# Patient Record
Sex: Female | Born: 1960 | Race: White | Hispanic: No | Marital: Married | Smoking: Former smoker
Health system: Southern US, Community
[De-identification: ages and names within clinical notes are randomized; demographics above are authoritative.]

## PROBLEM LIST (undated history)

## (undated) DIAGNOSIS — E669 Obesity, unspecified: Secondary | ICD-10-CM

## (undated) DIAGNOSIS — R609 Edema, unspecified: Secondary | ICD-10-CM

## (undated) DIAGNOSIS — I10 Essential (primary) hypertension: Secondary | ICD-10-CM

## (undated) DIAGNOSIS — N943 Premenstrual tension syndrome: Secondary | ICD-10-CM

## (undated) DIAGNOSIS — F419 Anxiety disorder, unspecified: Secondary | ICD-10-CM

## (undated) HISTORY — DX: Essential (primary) hypertension: I10

## (undated) HISTORY — DX: Anxiety disorder, unspecified: F41.9

## (undated) HISTORY — DX: Obesity, unspecified: E66.9

## (undated) HISTORY — DX: Edema, unspecified: R60.9

## (undated) HISTORY — DX: Premenstrual tension syndrome: N94.3

---

## 2007-09-06 ENCOUNTER — Ambulatory Visit: Payer: Self-pay | Admitting: Family Medicine

## 2009-09-10 ENCOUNTER — Emergency Department: Payer: Self-pay | Admitting: Emergency Medicine

## 2009-09-18 ENCOUNTER — Ambulatory Visit: Payer: Self-pay | Admitting: Vascular Surgery

## 2009-09-18 HISTORY — PX: CHOLECYSTECTOMY: SHX55

## 2009-09-22 ENCOUNTER — Inpatient Hospital Stay: Payer: Self-pay | Admitting: Surgery

## 2010-09-24 IMAGING — CT CT ABD-PELV W/ CM
1 of 2 series · 15 of 32 positions shown, 19 images · IV contrast (isovue)
Comparison: None

REASON FOR EXAM: (1) Bile leak; (2) same
COMMENTS:

PROCEDURE:     CT  - CT ABDOMEN / PELVIS  W  - September 22, 2009  [DATE]
RESULT:     History: Abdominal pain
TECHNIQUE: Multiple axial images of the abdomen and pelvis were performed
from the lung bases to the pubic symphysis, with p.o. contrast and with 100
ml of Isovue 370 intravenous contrast.

[Series 2: abdomen · axial · 0.86mm/px · z∈[-627,-132]mm · 15 of 109 slices shown, 19 images]
[im 5/109  soft-tissue]
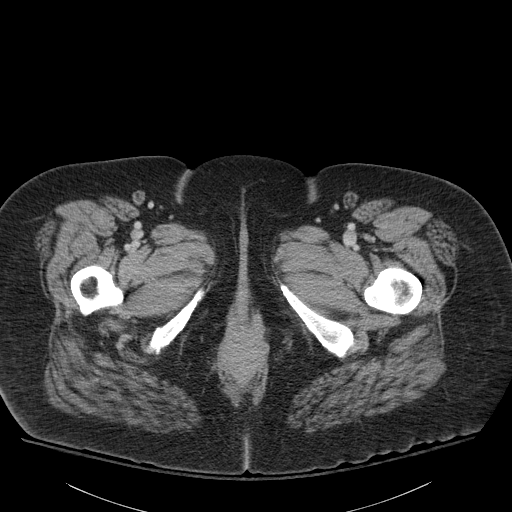
[im 5/109  bone]
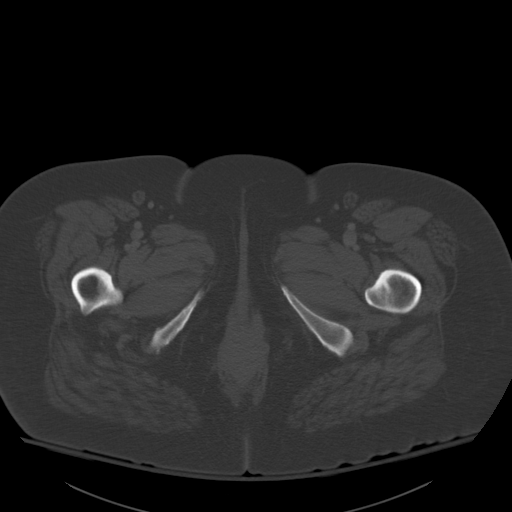
[im 13/109  soft-tissue]
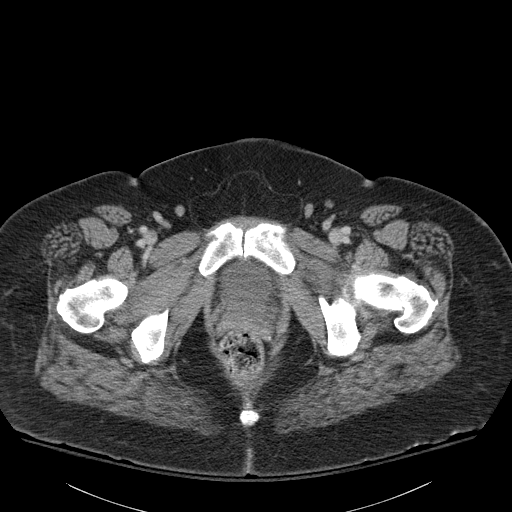
[im 22/109  soft-tissue]
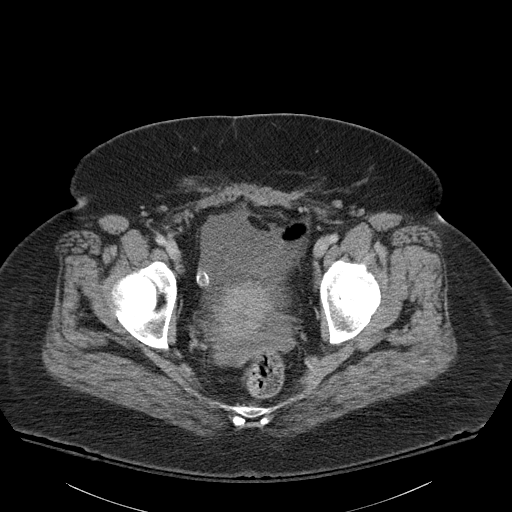
[im 31/109  soft-tissue]
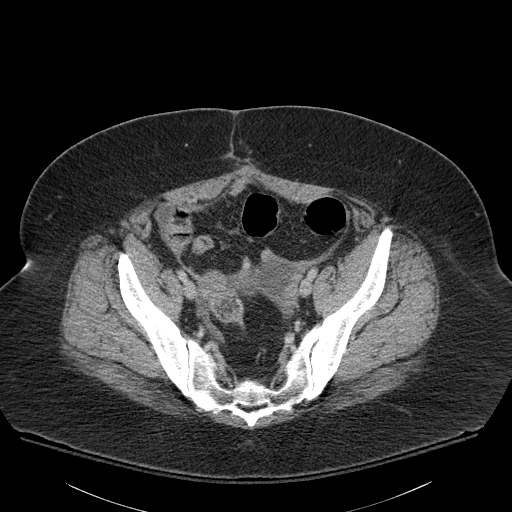
[im 39/109  soft-tissue]
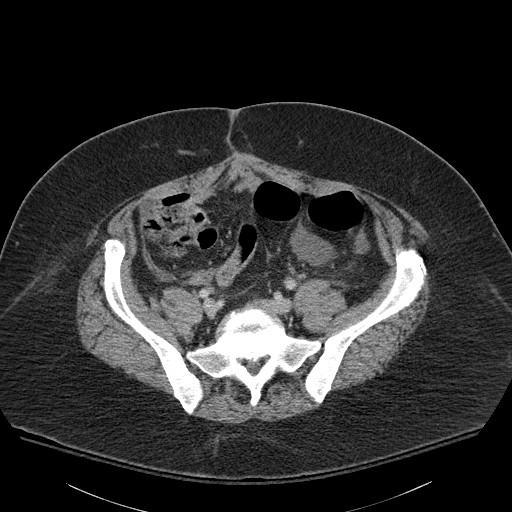
[im 48/109  soft-tissue]
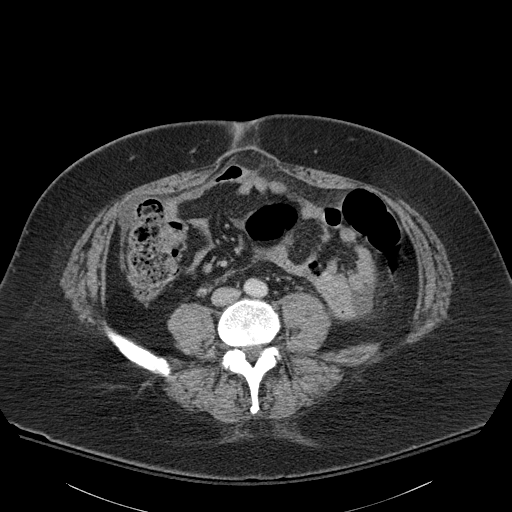
[im 57/109  soft-tissue]
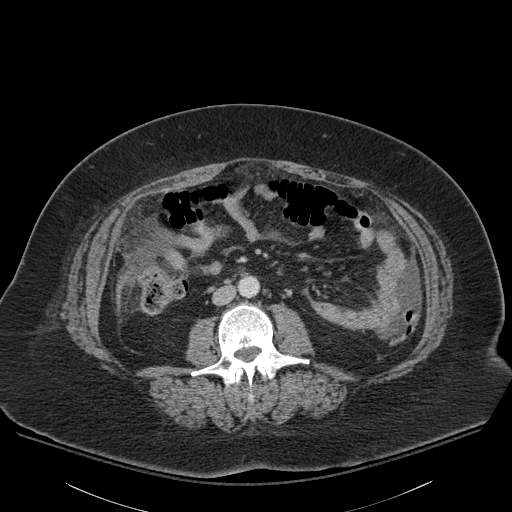
[im 61/109  soft-tissue]
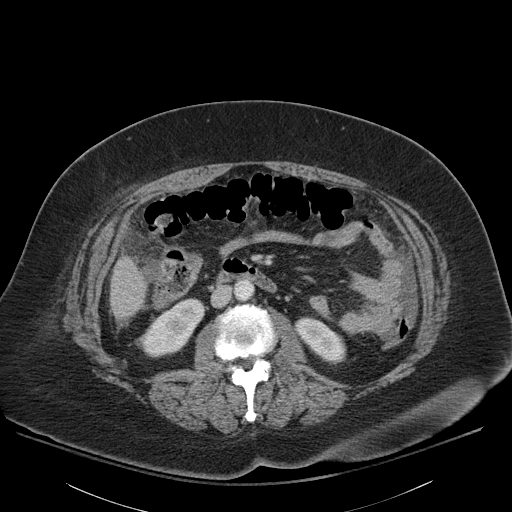
[im 70/109  soft-tissue]
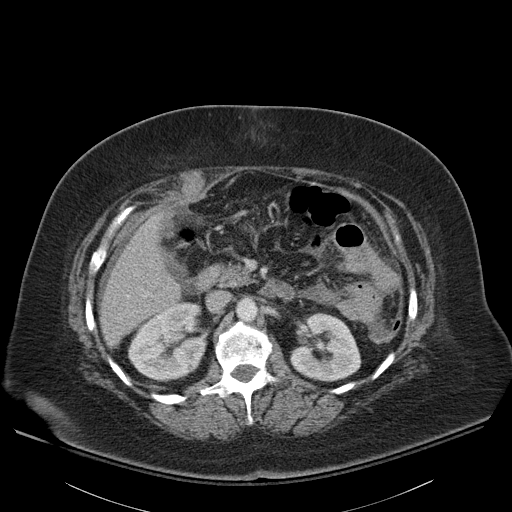
[im 70/109  bone]
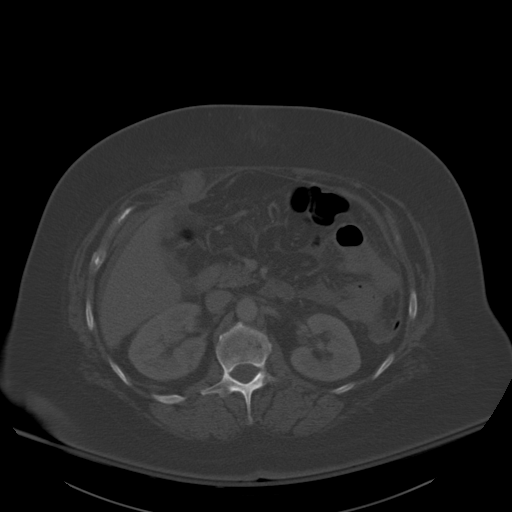
[im 78/109  soft-tissue]
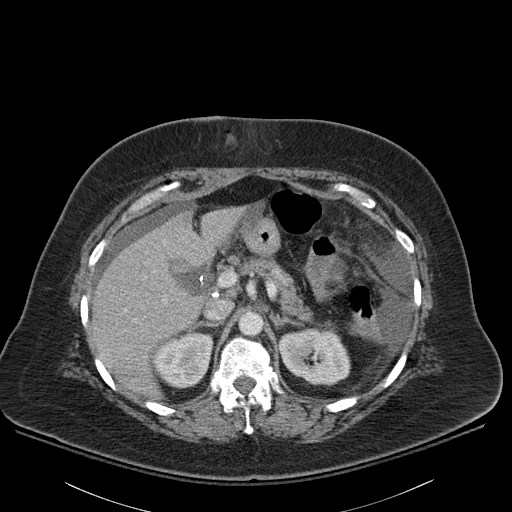
[im 87/109  soft-tissue]
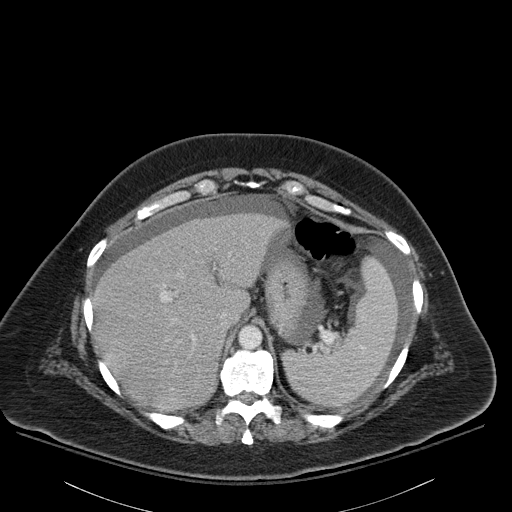
[im 91/109  lung]
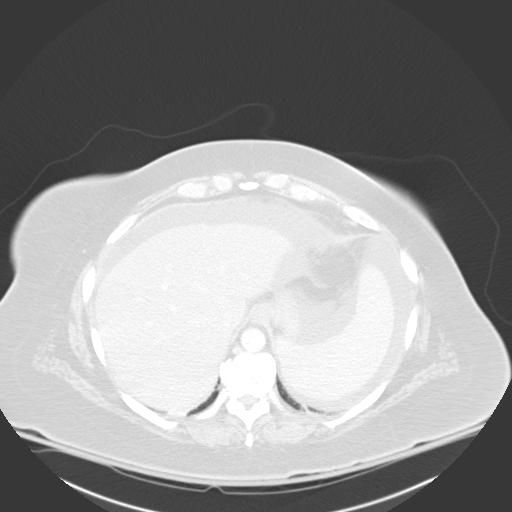
[im 96/109  soft-tissue]
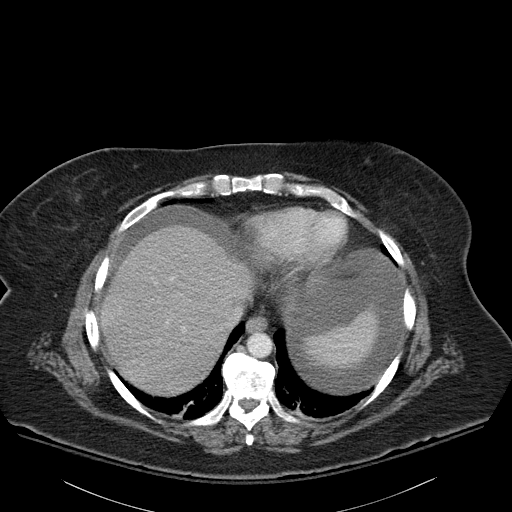
[im 96/109  lung]
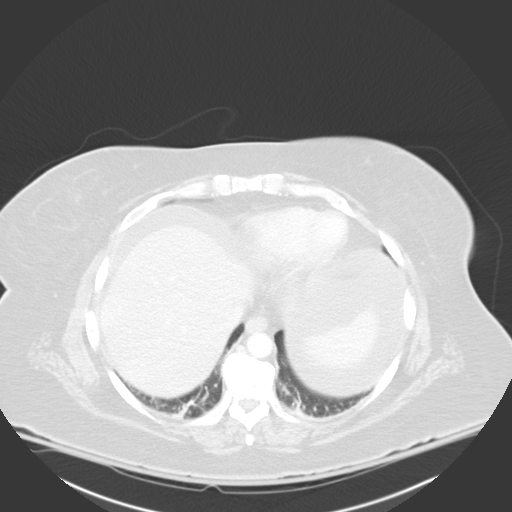
[im 100/109  lung]
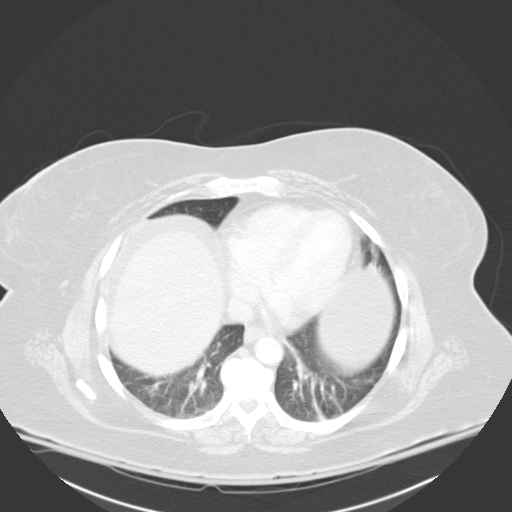
[im 104/109  soft-tissue]
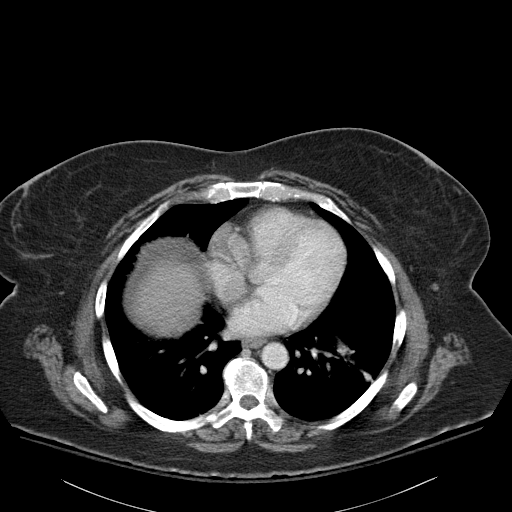
[im 104/109  lung]
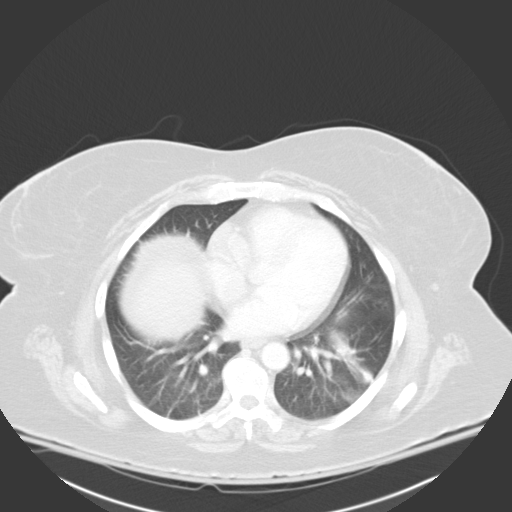

[15 of 32 positions shown; findings below may reference images not displayed]

FINDINGS: There is bibasilar atelectasis. There is no pneumothorax. The heart size is
normal.

The liver demonstrates no focal abnormality. There is no intrahepatic or
extrahepatic biliary ductal dilatation. The gallbladder is surgically
absent. There is a moderate amount of perihepatic and perisplenic ascites.
There is fluid present within the gallbladder fossa. There is high
attenuation material within the gallbladder fossa likely representing
hemorrhage. There is a moderate amount of pelvic free fluid. The spleen
demonstrates no focal abnormality. The kidneys, adrenal glands, and pancreas
are normal. The bladder is unremarkable.

The stomach, duodenum, small intestine, and large intestine demonstrate no
contrast extravasation or dilatation. There is no pneumoperitoneum,
pneumatosis, or portal venous gas. There is no abdominal or pelvic free
fluid. There is no lymphadenopathy. There is a 3.3 cm hypodense left ovarian
cystic mass.

The abdominal aorta is normal in caliber .

The osseous structures are unremarkable.
IMPRESSION: 1. There is a moderate perihepatic, perisplenic, and pelvic fluid. There is
fluid present within the gallbladder fossa. The appearance is concerning for
a bile leak given recent cholecystectomy. There is high attenuation material
within the gallbladder fossa most consistent with hemorrhage. If there is
persistent concern regarding a bile leak further evaluation with a HIDA scan
is recommended.

2. There is a 3.3 cm hypodense left ovarian cystic mass. Recommend further
evaluation with a pelvic ultrasound.

## 2011-02-02 LAB — HM PAP SMEAR

## 2012-12-02 ENCOUNTER — Ambulatory Visit: Payer: Self-pay | Admitting: Family Medicine

## 2012-12-20 ENCOUNTER — Ambulatory Visit: Payer: Self-pay | Admitting: Family Medicine

## 2013-08-01 ENCOUNTER — Ambulatory Visit: Payer: Self-pay | Admitting: Family Medicine

## 2014-09-25 ENCOUNTER — Ambulatory Visit: Payer: Self-pay

## 2015-05-15 ENCOUNTER — Other Ambulatory Visit: Payer: Self-pay | Admitting: Unknown Physician Specialty

## 2015-05-15 NOTE — Telephone Encounter (Signed)
Needs seen

## 2015-07-26 ENCOUNTER — Telehealth: Payer: Self-pay | Admitting: Unknown Physician Specialty

## 2015-07-26 MED ORDER — DULOXETINE HCL 30 MG PO CPEP: 30.0000 mg | ORAL_CAPSULE | Freq: Every day | ORAL | Status: DC

## 2015-07-26 NOTE — Telephone Encounter (Signed)
Pt no longer has insurance which means she cant afford the cymbalta and would like to know what else she could possibly take/do.

## 2015-07-26 NOTE — Telephone Encounter (Signed)
Called and told patient about the coupons at the pharmacy. Patient stated they didn't mention anything about coupons. Patient wants to know if we can change her dosage on the cymbalta from 60 mg to 30 mg. Patient states she wants to get off of the cymbalta because she read on the internet that it was very bad. Pharmacy is [facility] in [place].

## 2015-07-26 NOTE — Telephone Encounter (Signed)
Pt called and stated Cymbalta is $130. Can something cheaper be called in that can help her get off of this medication. Pt states she still has Prozac. Pt stated she just needs Chloe Nelson to return her call her ASAP. Thanks.

## 2015-07-26 NOTE — Telephone Encounter (Signed)
Routing to provider  

## 2015-07-26 NOTE — Telephone Encounter (Signed)
Price is typically pretty good, and most pharmacies have coupons.

## 2015-07-29 ENCOUNTER — Other Ambulatory Visit: Payer: Self-pay | Admitting: Unknown Physician Specialty

## 2015-07-30 ENCOUNTER — Ambulatory Visit (INDEPENDENT_AMBULATORY_CARE_PROVIDER_SITE_OTHER): Payer: Self-pay | Admitting: Unknown Physician Specialty

## 2015-07-30 ENCOUNTER — Encounter: Payer: Self-pay | Admitting: Unknown Physician Specialty

## 2015-07-30 VITALS — BP 119/80 | HR 91 | Temp 98.6°F | Ht 68.7 in | Wt 310.6 lb

## 2015-07-30 DIAGNOSIS — N943 Premenstrual tension syndrome: Secondary | ICD-10-CM | POA: Insufficient documentation

## 2015-07-30 DIAGNOSIS — M5441 Lumbago with sciatica, right side: Secondary | ICD-10-CM

## 2015-07-30 DIAGNOSIS — I10 Essential (primary) hypertension: Secondary | ICD-10-CM | POA: Insufficient documentation

## 2015-07-30 DIAGNOSIS — F325 Major depressive disorder, single episode, in full remission: Secondary | ICD-10-CM

## 2015-07-30 DIAGNOSIS — E785 Hyperlipidemia, unspecified: Secondary | ICD-10-CM | POA: Insufficient documentation

## 2015-07-30 DIAGNOSIS — R7301 Impaired fasting glucose: Secondary | ICD-10-CM

## 2015-07-30 DIAGNOSIS — F419 Anxiety disorder, unspecified: Secondary | ICD-10-CM | POA: Insufficient documentation

## 2015-07-30 DIAGNOSIS — R609 Edema, unspecified: Secondary | ICD-10-CM

## 2015-07-30 MED ORDER — CYCLOBENZAPRINE HCL 10 MG PO TABS: 10.0000 mg | ORAL_TABLET | Freq: Three times a day (TID) | ORAL | Status: DC | PRN

## 2015-07-30 MED ORDER — HYDROCODONE-ACETAMINOPHEN 5-325 MG PO TABS: 1.0000 | ORAL_TABLET | Freq: Four times a day (QID) | ORAL | Status: DC | PRN

## 2015-07-30 MED ORDER — MELOXICAM 15 MG PO TABS: 15.0000 mg | ORAL_TABLET | Freq: Every day | ORAL | Status: DC

## 2015-07-30 NOTE — Telephone Encounter (Signed)
Patient is coming in for a visit today so I will let her know that Elnita Maxwell wrote her a new rx when she arrives.

## 2015-07-30 NOTE — Patient Instructions (Signed)
Sciatica °Sciatica is pain, weakness, numbness, or tingling along the path of the sciatic nerve. The nerve starts in the lower back and runs down the back of each leg. The nerve controls the muscles in the lower leg and in the back of the knee, while also providing sensation to the back of the thigh, lower leg, and the sole of your foot. Sciatica is a symptom of another medical condition. For instance, nerve damage or certain conditions, such as a herniated disk or bone spur on the spine, pinch or put pressure on the sciatic nerve. This causes the pain, weakness, or other sensations normally associated with sciatica. Generally, sciatica only affects one side of the body. °CAUSES  °· Herniated or slipped disc. °· Degenerative disk disease. °· A pain disorder involving the narrow muscle in the buttocks (piriformis syndrome). °· Pelvic injury or fracture. °· Pregnancy. °· Tumor (rare). °SYMPTOMS  °Symptoms can vary from mild to very severe. The symptoms usually travel from the low back to the buttocks and down the back of the leg. Symptoms can include: °· Mild tingling or dull aches in the lower back, leg, or hip. °· Numbness in the back of the calf or sole of the foot. °· Burning sensations in the lower back, leg, or hip. °· Sharp pains in the lower back, leg, or hip. °· Leg weakness. °· Severe back pain inhibiting movement. °These symptoms may get worse with coughing, sneezing, laughing, or prolonged sitting or standing. Also, being overweight may worsen symptoms. °DIAGNOSIS  °Your caregiver will perform a physical exam to look for common symptoms of sciatica. He or she may ask you to do certain movements or activities that would trigger sciatic nerve pain. Other tests may be performed to find the cause of the sciatica. These may include: °· Blood tests. °· X-rays. °· Imaging tests, such as an MRI or CT scan. °TREATMENT  °Treatment is directed at the cause of the sciatic pain. Sometimes, treatment is not necessary  and the pain and discomfort goes away on its own. If treatment is needed, your caregiver may suggest: °· Over-the-counter medicines to relieve pain. °· Prescription medicines, such as anti-inflammatory medicine, muscle relaxants, or narcotics. °· Applying heat or ice to the painful area. °· Steroid injections to lessen pain, irritation, and inflammation around the nerve. °· Reducing activity during periods of pain. °· Exercising and stretching to strengthen your abdomen and improve flexibility of your spine. Your caregiver may suggest losing weight if the extra weight makes the back pain worse. °· Physical therapy. °· Surgery to eliminate what is pressing or pinching the nerve, such as a bone spur or part of a herniated disk. °HOME CARE INSTRUCTIONS  °· Only take over-the-counter or prescription medicines for pain or discomfort as directed by your caregiver. °· Apply ice to the affected area for 20 minutes, 3-4 times a day for the first 48-72 hours. Then try heat in the same way. °· Exercise, stretch, or perform your usual activities if these do not aggravate your pain. °· Attend physical therapy sessions as directed by your caregiver. °· Keep all follow-up appointments as directed by your caregiver. °· Do not wear high heels or shoes that do not provide proper support. °· Check your mattress to see if it is too soft. A firm mattress may lessen your pain and discomfort. °SEEK IMMEDIATE MEDICAL CARE IF:  °· You lose control of your bowel or bladder (incontinence). °· You have increasing weakness in the lower back, pelvis, buttocks,   or legs. °· You have redness or swelling of your back. °· You have a burning sensation when you urinate. °· You have pain that gets worse when you lie down or awakens you at night. °· Your pain is worse than you have experienced in the past. °· Your pain is lasting longer than 4 weeks. °· You are suddenly losing weight without reason. °MAKE SURE YOU: °· Understand these  instructions. °· Will watch your condition. °· Will get help right away if you are not doing well or get worse. °Document Released: 10/06/2001 Document Revised: 04/12/2012 Document Reviewed: 02/21/2012 °[employer]® Patient Information ©2015 [employer]. This information is not intended to replace advice given to you by your health care provider. Make sure you discuss any questions you have with your health care provider. ° °Sciatica °with Rehab °The sciatic nerve runs from the back down the leg and is responsible for sensation and control of the muscles in the back (posterior) side of the thigh, lower leg, and foot. Sciatica is a condition that is characterized by inflammation of this nerve.  °SYMPTOMS  °· Signs of nerve damage, including numbness and/or weakness along the posterior side of the lower extremity. °· Pain in the back of the thigh that may also travel down the leg. °· Pain that worsens when sitting for long periods of time. °· Occasionally, pain in the back or buttock. °CAUSES  °Inflammation of the sciatic nerve is the cause of sciatica. The inflammation is due to something irritating the nerve. Common sources of irritation include: °· Sitting for long periods of time. °· Direct trauma to the nerve. °· Arthritis of the spine. °· Herniated or ruptured disk. °· Slipping of the vertebrae (spondylolisthesis). °· Pressure from soft tissues, such as muscles or ligament-like tissue (fascia). °RISK INCREASES WITH: °· Sports that place pressure or stress on the spine (football or weightlifting). °· Poor strength and flexibility. °· Failure to warm up properly before activity. °· Family history of low back pain or disk disorders. °· Previous back injury or surgery. °· Poor body mechanics, especially when lifting, or poor posture. °PREVENTION  °· Warm up and stretch properly before activity. °· Maintain physical fitness: °¨ Strength, flexibility, and endurance. °· Cardiovascular fitness. °· Learn and use proper  technique, especially with posture and lifting. When possible, have coach correct improper technique. °· Avoid activities that place stress on the spine. °PROGNOSIS °If treated properly, then sciatica usually resolves within 6 weeks. However, occasionally surgery is necessary.  °RELATED COMPLICATIONS  °· Permanent nerve damage, including pain, numbness, tingle, or weakness. °· Chronic back pain. °· Risks of surgery: infection, bleeding, nerve damage, or damage to surrounding tissues. °TREATMENT °Treatment initially involves resting from any activities that aggravate your symptoms. The use of ice and medication may help reduce pain and inflammation. The use of strengthening and stretching exercises may help reduce pain with activity. These exercises may be performed at home or with referral to a therapist. A therapist may recommend further treatments, such as transcutaneous electronic nerve stimulation (TENS) or ultrasound. Your caregiver may recommend corticosteroid injections to help reduce inflammation of the sciatic nerve. If symptoms persist despite non-surgical (conservative) treatment, then surgery may be recommended. °MEDICATION °· If pain medication is necessary, then nonsteroidal anti-inflammatory medications, such as aspirin and ibuprofen, or other minor pain relievers, such as acetaminophen, are often recommended. °· Do not take pain medication for 7 days before surgery. °· Prescription pain relievers may be given if deemed necessary by your caregiver.   Use only as directed and only as much as you need. °· Ointments applied to the skin may be helpful. °· Corticosteroid injections may be given by your caregiver. These injections should be reserved for the most serious cases, because they may only be given a certain number of times. °HEAT AND COLD °· Cold treatment (icing) relieves pain and reduces inflammation. Cold treatment should be applied for 10 to 15 minutes every 2 to 3 hours for inflammation and  pain and immediately after any activity that aggravates your symptoms. Use ice packs or massage the area with a piece of ice (ice massage). °· Heat treatment may be used prior to performing the stretching and strengthening activities prescribed by your caregiver, physical therapist, or athletic trainer. Use a heat pack or soak the injury in warm water. °SEEK MEDICAL CARE IF: °· Treatment seems to offer no benefit, or the condition worsens. °· Any medications produce adverse side effects. °EXERCISES  °RANGE OF MOTION (ROM) AND STRETCHING EXERCISES - Sciatica °Most people with sciatic will find that their symptoms worsen with either excessive bending forward (flexion) or arching at the low back (extension). The exercises which will help resolve your symptoms will focus on the opposite motion. Your physician, physical therapist or athletic trainer will help you determine which exercises will be most helpful to resolve your low back pain. Do not complete any exercises without first consulting with your clinician. Discontinue any exercises which worsen your symptoms until you speak to your clinician. If you have pain, numbness or tingling which travels down into your buttocks, leg or foot, the goal of the therapy is for these symptoms to move closer to your back and eventually resolve. Occasionally, these leg symptoms will get better, but your low back pain may worsen; this is typically an indication of progress in your rehabilitation. Be certain to be very alert to any changes in your symptoms and the activities in which you participated in the 24 hours prior to the change. Sharing this information with your clinician will allow him/her to most efficiently treat your condition. °These exercises may help you when beginning to rehabilitate your injury. Your symptoms may resolve with or without further involvement from your physician, physical therapist or athletic trainer. While completing these exercises, remember:   °· Restoring tissue flexibility helps normal motion to return to the joints. This allows healthier, less painful movement and activity. °· An effective stretch should be held for at least 30 seconds. °· A stretch should never be painful. You should only feel a gentle lengthening or release in the stretched tissue. °FLEXION RANGE OF MOTION AND STRETCHING EXERCISES: °STRETCH - Flexion, Single Knee to Chest  °· Lie on a firm bed or floor with both legs extended in front of you. °· Keeping one leg in contact with the floor, bring your opposite knee to your chest. Hold your leg in place by either grabbing behind your thigh or at your knee. °· Pull until you feel a gentle stretch in your low back. Hold __________ seconds. °· Slowly release your grasp and repeat the exercise with the opposite side. °Repeat __________ times. Complete this exercise __________ times per day.  °STRETCH - Flexion, Double Knee to Chest °· Lie on a firm bed or floor with both legs extended in front of you. °· Keeping one leg in contact with the floor, bring your opposite knee to your chest. °· Tense your stomach muscles to support your back and then lift your other knee to your   chest. Hold your legs in place by either grabbing behind your thighs or at your knees. °· Pull both knees toward your chest until you feel a gentle stretch in your low back. Hold __________ seconds. °· Tense your stomach muscles and slowly return one leg at a time to the floor. °Repeat __________ times. Complete this exercise __________ times per day.  °STRETCH - Low Trunk Rotation  °· Lie on a firm bed or floor. Keeping your legs in front of you, bend your knees so they are both pointed toward the ceiling and your feet are flat on the floor. °· Extend your arms out to the side. This will stabilize your upper body by keeping your shoulders in contact with the floor. °· Gently and slowly drop both knees together to one side until you feel a gentle stretch in your low back.  Hold for __________ seconds. °· Tense your stomach muscles to support your low back as you bring your knees back to the starting position. Repeat the exercise to the other side. °Repeat __________ times. Complete this exercise __________ times per day  °EXTENSION RANGE OF MOTION AND FLEXIBILITY EXERCISES: °STRETCH - Extension, Prone on Elbows °· Lie on your stomach on the floor, a bed will be too soft. Place your palms about shoulder width apart and at the height of your head. °· Place your elbows under your shoulders. If this is too painful, stack pillows under your chest. °· Allow your body to relax so that your hips drop lower and make contact more completely with the floor. °· Hold this position for __________ seconds. °· Slowly return to lying flat on the floor. °Repeat __________ times. Complete this exercise __________ times per day.  °RANGE OF MOTION - Extension, Prone Press Ups °· Lie on your stomach on the floor, a bed will be too soft. Place your palms about shoulder width apart and at the height of your head. °· Keeping your back as relaxed as possible, slowly straighten your elbows while keeping your hips on the floor. You may adjust the placement of your hands to maximize your comfort. As you gain motion, your hands will come more underneath your shoulders. °· Hold this position __________ seconds. °· Slowly return to lying flat on the floor. °Repeat __________ times. Complete this exercise __________ times per day.  °STRENGTHENING EXERCISES - Sciatica  °These exercises may help you when beginning to rehabilitate your injury. These exercises should be done near your "sweet spot." This is the neutral, low-back arch, somewhere between fully rounded and fully arched, that is your least painful position. When performed in this safe range of motion, these exercises can be used for people who have either a flexion or extension based injury. These exercises may resolve your symptoms with or without further  involvement from your physician, physical therapist or athletic trainer. While completing these exercises, remember:  °· Muscles can gain both the endurance and the strength needed for everyday activities through controlled exercises. °· Complete these exercises as instructed by your physician, physical therapist or athletic trainer. Progress with the resistance and repetition exercises only as your caregiver advises. °· You may experience muscle soreness or fatigue, but the pain or discomfort you are trying to eliminate should never worsen during these exercises. If this pain does worsen, stop and make certain you are following the directions exactly. If the pain is still present after adjustments, discontinue the exercise until you can discuss the trouble with your clinician. °STRENGTHENING - Deep Abdominals,   Pelvic Tilt  °· Lie on a firm bed or floor. Keeping your legs in front of you, bend your knees so they are both pointed toward the ceiling and your feet are flat on the floor. °· Tense your lower abdominal muscles to press your low back into the floor. This motion will rotate your pelvis so that your tail bone is scooping upwards rather than pointing at your feet or into the floor. °· With a gentle tension and even breathing, hold this position for __________ seconds. °Repeat __________ times. Complete this exercise __________ times per day.  °STRENGTHENING - Abdominals, Crunches  °· Lie on a firm bed or floor. Keeping your legs in front of you, bend your knees so they are both pointed toward the ceiling and your feet are flat on the floor. Cross your arms over your chest. °· Slightly tip your chin down without bending your neck. °· Tense your abdominals and slowly lift your trunk high enough to just clear your shoulder blades. Lifting higher can put excessive stress on the low back and does not further strengthen your abdominal muscles. °· Control your return to the starting position. °Repeat __________  times. Complete this exercise __________ times per day.  °STRENGTHENING - Quadruped, Opposite UE/LE Lift °· Assume a hands and knees position on a firm surface. Keep your hands under your shoulders and your knees under your hips. You may place padding under your knees for comfort. °· Find your neutral spine and gently tense your abdominal muscles so that you can maintain this position. Your shoulders and hips should form a rectangle that is parallel with the floor and is not twisted. °· Keeping your trunk steady, lift your right hand no higher than your shoulder and then your left leg no higher than your hip. Make sure you are not holding your breath. Hold this position __________ seconds. °· Continuing to keep your abdominal muscles tense and your back steady, slowly return to your starting position. Repeat with the opposite arm and leg. °Repeat __________ times. Complete this exercise __________ times per day.  °STRENGTHENING - Abdominals and Quadriceps, Straight Leg Raise  °· Lie on a firm bed or floor with both legs extended in front of you. °· Keeping one leg in contact with the floor, bend the other knee so that your foot can rest flat on the floor. °· Find your neutral spine, and tense your abdominal muscles to maintain your spinal position throughout the exercise. °· Slowly lift your straight leg off the floor about 6 inches for a count of 15, making sure to not hold your breath. °· Still keeping your neutral spine, slowly lower your leg all the way to the floor. °Repeat this exercise with each leg __________ times. Complete this exercise __________ times per day. °POSTURE AND BODY MECHANICS CONSIDERATIONS - Sciatica °Keeping correct posture when sitting, standing or completing your activities will reduce the stress put on different body tissues, allowing injured tissues a chance to heal and limiting painful experiences. The following are general guidelines for improved posture. Your physician or physical  therapist will provide you with any instructions specific to your needs. While reading these guidelines, remember: °· The exercises prescribed by your provider will help you have the flexibility and strength to maintain correct postures. °· The correct posture provides the optimal environment for your joints to work. All of your joints have less wear and tear when properly supported by a spine with good posture. This means you will experience a   healthier, less painful body. °· Correct posture must be practiced with all of your activities, especially prolonged sitting and standing. Correct posture is as important when doing repetitive low-stress activities (typing) as it is when doing a single heavy-load activity (lifting). °RESTING POSITIONS °Consider which positions are most painful for you when choosing a resting position. If you have pain with flexion-based activities (sitting, bending, stooping, squatting), choose a position that allows you to rest in a less flexed posture. You would want to avoid curling into a fetal position on your side. If your pain worsens with extension-based activities (prolonged standing, working overhead), avoid resting in an extended position such as sleeping on your stomach. Most people will find more comfort when they rest with their spine in a more neutral position, neither too rounded nor too arched. Lying on a non-sagging bed on your side with a pillow between your knees, or on your back with a pillow under your knees will often provide some relief. Keep in mind, being in any one position for a prolonged period of time, no matter how correct your posture, can still lead to stiffness. °PROPER SITTING POSTURE °In order to minimize stress and discomfort on your spine, you must sit with correct posture Sitting with good posture should be effortless for a healthy body. Returning to good posture is a gradual process. Many people can work toward this most comfortably by using various  supports until they have the flexibility and strength to maintain this posture on their own. °When sitting with proper posture, your ears will fall over your shoulders and your shoulders will fall over your hips. You should use the back of the chair to support your upper back. Your low back will be in a neutral position, just slightly arched. You may place a small pillow or folded towel at the base of your low back for support.  °When working at a desk, create an environment that supports good, upright posture. Without extra support, muscles fatigue and lead to excessive strain on joints and other tissues. Keep these recommendations in mind: °CHAIR:  °· A chair should be able to slide under your desk when your back makes contact with the back of the chair. This allows you to work closely. °· The chair's height should allow your eyes to be level with the upper part of your monitor and your hands to be slightly lower than your elbows. °BODY POSITION °· Your feet should make contact with the floor. If this is not possible, use a foot rest. °· Keep your ears over your shoulders. This will reduce stress on your neck and low back. °INCORRECT SITTING POSTURES  °· If you are feeling tired and unable to assume a healthy sitting posture, do not slouch or slump. This puts excessive strain on your back tissues, causing more damage and pain. Healthier options include: °· Using more support, like a lumbar pillow. °· Switching tasks to something that requires you to be upright or walking. °· Talking a brief walk. °· Lying down to rest in a neutral-spine position. °PROLONGED STANDING WHILE SLIGHTLY LEANING FORWARD  °When completing a task that requires you to lean forward while standing in one place for a long time, place either foot up on a stationary 2-4 inch high object to help maintain the best posture. When both feet are on the ground, the low back tends to lose its slight inward curve. If this curve flattens (or becomes too  large), then the back and your other joints   will experience too much stress, fatigue more quickly and can cause pain.  °CORRECT STANDING POSTURES °Proper standing posture should be assumed with all daily activities, even if they only take a few moments, like when brushing your teeth. As in sitting, your ears should fall over your shoulders and your shoulders should fall over your hips. You should keep a slight tension in your abdominal muscles to brace your spine. Your tailbone should point down to the ground, not behind your body, resulting in an over-extended swayback posture.  °INCORRECT STANDING POSTURES  °Common incorrect standing postures include a forward head, locked knees and/or an excessive swayback. °WALKING °Walk with an upright posture. Your ears, shoulders and hips should all line-up. °PROLONGED ACTIVITY IN A FLEXED POSITION °When completing a task that requires you to bend forward at your waist or lean over a low surface, try to find a way to stabilize 3 of 4 of your limbs. You can place a hand or elbow on your thigh or rest a knee on the surface you are reaching across. This will provide you more stability so that your muscles do not fatigue as quickly. By keeping your knees relaxed, or slightly bent, you will also reduce stress across your low back. °CORRECT LIFTING TECHNIQUES °DO :  °· Assume a wide stance. This will provide you more stability and the opportunity to get as close as possible to the object which you are lifting. °· Tense your abdominals to brace your spine; then bend at the knees and hips. Keeping your back locked in a neutral-spine position, lift using your leg muscles. Lift with your legs, keeping your back straight. °· Test the weight of unknown objects before attempting to lift them. °· Try to keep your elbows locked down at your sides in order get the best strength from your shoulders when carrying an object. °· Always ask for help when lifting heavy or awkward  objects. °INCORRECT LIFTING TECHNIQUES °DO NOT:  °· Lock your knees when lifting, even if it is a small object. °· Bend and twist. Pivot at your feet or move your feet when needing to change directions. °· Assume that you cannot safely pick up a paperclip without proper posture. °Document Released: 10/12/2005 Document Revised: 02/26/2014 Document Reviewed: 01/24/2009 °[employer]® Patient Information ©2015 [employer]. This information is not intended to replace advice given to you by your health care provider. Make sure you discuss any questions you have with your health care provider. ° °

## 2015-07-30 NOTE — Assessment & Plan Note (Signed)
Continue tapering Cymbalta.  Pt ed on back pain and cymbalta.  Pt really want to come off.

## 2015-07-30 NOTE — Progress Notes (Signed)
BP 119/80 mmHg  Pulse 91  Temp(Src) 98.6 F (37 C)  Ht 5' 8.7" (1.745 m)  Wt 310 lb 9.6 oz (140.887 kg)  BMI 46.27 kg/m2  LMP 05/09/2015 (Approximate)   Subjective:    Patient ID: Chloe Nelson, female    DOB: 08/29/1961, 54 y.o.   MRN: [id number]  HPI: Chloe Nelson is a 54 y.o. female  Chief Complaint  Patient presents with  . Sciatica    pt states she is having sciatica pain on the right side and has been for about 4 days now. States pain is so sharp that it takes her breath away. If any prescriptions are written today, patient would like it sent to [facility].   Pt states she is having pain in her right buttocks and shoots all the way down leg.  Whole leg will tingle.  She does not know what she did but is trying to exercise.  She describes pain as severe, sharp and shooting.  Ibuprofen lasts for about an hour.  Heat helps.  Moving in any way makes it worse.    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  She would like to continue tapering Cymbalta  Relevant past medical, surgical, family and social history reviewed and updated as indicated. Interim medical history since our last visit reviewed. Allergies and medications reviewed and updated.  Review of Systems  Constitutional: Negative.   HENT: Negative.   Eyes: Negative.   Respiratory: Negative.   Cardiovascular: Negative.   Gastrointestinal: Negative.   Endocrine: Negative.   Genitourinary: Negative.   Musculoskeletal: Negative.   Skin: Negative.   Allergic/Immunologic: Negative.   Neurological: Negative.   Hematological: Negative.   Psychiatric/Behavioral: Negative.     Per HPI unless specifically indicated above     Objective:    BP 119/80 mmHg  Pulse 91  Temp(Src) 98.6 F (37 C)  Ht 5' 8.7" (1.745 m)  Wt 310 lb 9.6 oz (140.887 kg)  BMI 46.27 kg/m2  LMP 05/09/2015 (Approximate)  Wt Readings from Last 3 Encounters:  07/30/15 310 lb 9.6 oz (140.887 kg)  03/12/15 306 lb (138.801 kg)     Physical Exam  Constitutional: She is oriented to person, place, and time. She appears well-developed and well-nourished. No distress.  HENT:  Head: Normocephalic and atraumatic.  Eyes: Conjunctivae and lids are normal. Right eye exhibits no discharge. Left eye exhibits no discharge. No scleral icterus.  Cardiovascular: Normal rate and regular rhythm.   Pulmonary/Chest: Effort normal and breath sounds normal. No respiratory distress.  Abdominal: Normal appearance and bowel sounds are normal. She exhibits no distension. There is no splenomegaly or hepatomegaly. There is no tenderness.  Musculoskeletal:       Lumbar back: She exhibits decreased range of motion and pain. She exhibits no tenderness.  Decreased flexion and lateral bending particularly to the right.    DTRs and sensation 4 plus and equal bilaterally.    Neurological: She is alert and oriented to person, place, and time.  Skin: Skin is warm, dry and intact. No rash noted. No pallor.  Psychiatric: She has a normal mood and affect. Her behavior is normal. Judgment and thought content normal.  Nursing note reviewed.   Results for orders placed or performed in visit on 07/30/15  HM PAP SMEAR  Result Value Ref Range   HM Pap smear from [facility]       Assessment & Plan:   Problem List Items Addressed This Visit  Unprioritized   Major depression in remission (HCC)    Continue tapering Cymbalta.  Pt ed on back pain and cymbalta.  Pt really want to come off.         Other Visit Diagnoses    Acute bilateral low back pain with right-sided sciatica    -  Primary    Pt ed on exercises and sciatica.  Rx fro Hydrocodone, Flexeril, and Mobic.  consider referral to physiatry.          Follow up plan: Return if symptoms worsen or fail to improve.

## 2015-08-29 ENCOUNTER — Other Ambulatory Visit: Payer: Self-pay | Admitting: Unknown Physician Specialty

## 2015-09-30 ENCOUNTER — Other Ambulatory Visit: Payer: Self-pay | Admitting: Unknown Physician Specialty

## 2015-10-01 ENCOUNTER — Other Ambulatory Visit: Payer: Self-pay

## 2015-10-01 MED ORDER — ATENOLOL 25 MG PO TABS: 25.0000 mg | ORAL_TABLET | Freq: Every day | ORAL | Status: DC

## 2015-10-01 NOTE — Telephone Encounter (Signed)
LAST VISIT: 07/30/2015  Rx refilled yesterday 10/01/2015 #30. Patient now requesting a 90 day supply of the medication.

## 2015-10-25 ENCOUNTER — Telehealth: Payer: Self-pay | Admitting: Unknown Physician Specialty

## 2015-10-25 MED ORDER — HYDROCHLOROTHIAZIDE 25 MG PO TABS: 25.0000 mg | ORAL_TABLET | Freq: Every day | ORAL | Status: DC

## 2015-10-25 MED ORDER — ATENOLOL 25 MG PO TABS: 25.0000 mg | ORAL_TABLET | Freq: Every day | ORAL | Status: DC

## 2015-10-25 MED ORDER — ENALAPRIL MALEATE 20 MG PO TABS: 20.0000 mg | ORAL_TABLET | Freq: Two times a day (BID) | ORAL | Status: DC

## 2015-10-25 NOTE — Telephone Encounter (Signed)
Pt called stated her RX's need to be changed to 90 day supplies. Pt also needs new RX for Atenolol and HCTZ and Analipril. Pharm is [facility] in [place]. Thanks.

## 2015-10-25 NOTE — Telephone Encounter (Signed)
Called patient, no voicemail set up, will try to call again later.

## 2015-10-25 NOTE — Telephone Encounter (Signed)
Routing to provider. Atenolol was just refilled on 10/01/15 for 90 with 1 refill. Patient was seen 07/30/15.

## 2015-10-25 NOTE — Telephone Encounter (Signed)
Patient returned my call and left me a voicemail because I was not at my desk. I tried to call her back but she did not answer, and no voicemail. Will try to call again later.

## 2015-10-25 NOTE — Telephone Encounter (Signed)
Pt needs appointment for f/u of BP

## 2015-10-25 NOTE — Telephone Encounter (Signed)
Pt called back, pt stated she has to look at her calendar will call back Monday to schedule an appointment.

## 2015-10-30 NOTE — Telephone Encounter (Signed)
Called patient but there was no answer and no voicemail set up. Will try to call again later.

## 2015-10-31 NOTE — Telephone Encounter (Signed)
Called patient but there was no answer and no voicemail available. This was the 3rd attempt at calling the patient so I am sending her a letter asking for her to please give our office a call to schedule follow-up.

## 2015-12-10 ENCOUNTER — Encounter: Payer: Self-pay | Admitting: Family Medicine

## 2015-12-10 ENCOUNTER — Ambulatory Visit (INDEPENDENT_AMBULATORY_CARE_PROVIDER_SITE_OTHER): Payer: Self-pay | Admitting: Family Medicine

## 2015-12-10 VITALS — BP 125/89 | HR 89 | Temp 99.1°F | Ht 69.2 in | Wt 312.0 lb

## 2015-12-10 DIAGNOSIS — I10 Essential (primary) hypertension: Secondary | ICD-10-CM

## 2015-12-10 MED ORDER — HYDROCHLOROTHIAZIDE 25 MG PO TABS: 25.0000 mg | ORAL_TABLET | Freq: Every day | ORAL | Status: DC

## 2015-12-10 MED ORDER — ENALAPRIL MALEATE 20 MG PO TABS: 20.0000 mg | ORAL_TABLET | Freq: Every day | ORAL | Status: DC

## 2015-12-10 MED ORDER — ATENOLOL 25 MG PO TABS: 25.0000 mg | ORAL_TABLET | Freq: Every day | ORAL | Status: DC

## 2015-12-10 NOTE — Progress Notes (Signed)
BP 125/89 mmHg  Pulse 89  Temp(Src) 99.1 F (37.3 C)  Ht 5' 9.2" (1.758 m)  Wt 312 lb (141.522 kg)  BMI 45.79 kg/m2  SpO2 96%  LMP 05/27/2015 (Approximate)   Subjective:    Patient ID: Chloe Nelson, female    DOB: 1960-11-15, 55 y.o.   MRN: [id number]  HPI: Chloe Nelson is a 55 y.o. female who presents today to change providers within the office  Chief Complaint  Patient presents with  . Hypertension    Patient would like to discuss her BP meds   HYPERTENSION- has only been taking enlalapril 1x a day Hypertension status: controlled  Satisfied with current treatment? yes Duration of hypertension: chronic BP monitoring frequency:  not checking BP medication side effects:  no Medication compliance: excellent compliance Aspirin: no Recurrent headaches: no Visual changes: no Palpitations: no Dyspnea: no Chest pain: no Lower extremity edema: no Dizzy/lightheaded: no   Relevant past medical, surgical, family and social history reviewed and updated as indicated. Interim medical history since our last visit reviewed. Allergies and medications reviewed and updated.  Review of Systems  Constitutional: Negative.   Respiratory: Negative.   Cardiovascular: Negative.   Psychiatric/Behavioral: Negative.    Per HPI unless specifically indicated above     Objective:    BP 125/89 mmHg  Pulse 89  Temp(Src) 99.1 F (37.3 C)  Ht 5' 9.2" (1.758 m)  Wt 312 lb (141.522 kg)  BMI 45.79 kg/m2  SpO2 96%  LMP 05/27/2015 (Approximate)  Wt Readings from Last 3 Encounters:  12/10/15 312 lb (141.522 kg)  07/30/15 310 lb 9.6 oz (140.887 kg)  03/12/15 306 lb (138.801 kg)    Physical Exam  Constitutional: She is oriented to person, place, and time. She appears well-developed and well-nourished. No distress.  HENT:  Head: Normocephalic and atraumatic.  Right Ear: Hearing normal.  Left Ear: Hearing normal.  Nose: Nose normal.  Eyes: Conjunctivae and lids are normal. Right eye  exhibits no discharge. Left eye exhibits no discharge. No scleral icterus.  Pulmonary/Chest: Effort normal. No respiratory distress.  Musculoskeletal: Normal range of motion.  Neurological: She is alert and oriented to person, place, and time.  Skin: Skin is intact. No rash noted.  Psychiatric: She has a normal mood and affect. Her speech is normal and behavior is normal. Judgment and thought content normal. Cognition and memory are normal.    Results for orders placed or performed in visit on 07/30/15  HM PAP SMEAR  Result Value Ref Range   HM Pap smear from PP       Assessment & Plan:   Problem List Items Addressed This Visit      Cardiovascular and Mediastinum   Hypertension - Primary    Under good control. Continue current regimen. Continue to monitor. Recheck 6 months at physical.       Relevant Medications   enalapril (VASOTEC) 20 MG tablet   atenolol (TENORMIN) 25 MG tablet   hydrochlorothiazide (HYDRODIURIL) 25 MG tablet   Other Relevant Orders   Basic metabolic panel       Follow up plan: Return in about 6 months (around 06/08/2016) for Physical.

## 2015-12-10 NOTE — Assessment & Plan Note (Signed)
Under good control. Continue current regimen. Continue to monitor. Recheck 6 months at physical. 

## 2015-12-11 ENCOUNTER — Encounter: Payer: Self-pay | Admitting: Family Medicine

## 2015-12-11 LAB — BASIC METABOLIC PANEL
BUN / CREAT RATIO: 17 (ref 9–23)
BUN: 12 mg/dL (ref 6–24)
CALCIUM: 9.7 mg/dL (ref 8.7–10.2)
CHLORIDE: 97 mmol/L (ref 96–106)
CO2: 21 mmol/L (ref 18–29)
Creatinine, Ser: 0.7 mg/dL (ref 0.57–1.00)
GFR, EST AFRICAN AMERICAN: 114 mL/min/{1.73_m2} (ref >59)
GFR, EST NON AFRICAN AMERICAN: 99 mL/min/{1.73_m2} (ref >59)
Glucose: 103 mg/dL — ABNORMAL HIGH (ref 65–99)
Potassium: 3.8 mmol/L (ref 3.5–5.2)
Sodium: 140 mmol/L (ref 134–144)

## 2015-12-24 ENCOUNTER — Telehealth: Payer: Self-pay | Admitting: Family Medicine

## 2015-12-24 NOTE — Telephone Encounter (Signed)
Patient called stating that her cough from 12/10/15 has not gone away and wants to know if Dr. Laural Benes can call her something in to [address]?

## 2015-12-24 NOTE — Telephone Encounter (Signed)
Dr.Johnson, do you remember patient saying anything about a cough? Do not see anything in your note.  Does she need to be seen or can you call something in?

## 2015-12-25 NOTE — Telephone Encounter (Signed)
She would need to be seen or she can take something OTC for the cough.

## 2015-12-25 NOTE — Telephone Encounter (Signed)
Patient notified

## 2016-06-17 ENCOUNTER — Other Ambulatory Visit: Payer: Self-pay | Admitting: Family Medicine

## 2016-06-17 NOTE — Telephone Encounter (Signed)
Christin: Please get patient scheduled and notify Dr.Johnson of appointment.

## 2016-06-17 NOTE — Telephone Encounter (Signed)
Needs an appointment. Will get her enough medicine to make it to appointment when it's booked.   

## 2016-06-19 ENCOUNTER — Telehealth: Payer: Self-pay | Admitting: Family Medicine

## 2016-06-19 NOTE — Telephone Encounter (Signed)
Pt called informed she needed to schedule an appt before RX can be sent. Pt stated she has no insurance, she thinks it is ridiculous that she has to come in, that this is her BP medication she is talking about, pt informed of what Dr. Henriette CombsJohnson's message stated again. Pt stated whatever Dr. Laural BenesJohnson calls in better be worth her while as she has no insurance and has to pay out of pocket. Pt scheduled appt for 06/22/16 @ 10:45. Thanks.

## 2016-06-22 ENCOUNTER — Encounter: Payer: Self-pay | Admitting: Family Medicine

## 2016-06-22 ENCOUNTER — Ambulatory Visit (INDEPENDENT_AMBULATORY_CARE_PROVIDER_SITE_OTHER): Payer: Self-pay | Admitting: Family Medicine

## 2016-06-22 VITALS — BP 138/86 | HR 99 | Temp 98.4°F | Ht 69.2 in | Wt 318.0 lb

## 2016-06-22 DIAGNOSIS — E785 Hyperlipidemia, unspecified: Secondary | ICD-10-CM

## 2016-06-22 DIAGNOSIS — I10 Essential (primary) hypertension: Secondary | ICD-10-CM

## 2016-06-22 DIAGNOSIS — R7301 Impaired fasting glucose: Secondary | ICD-10-CM

## 2016-06-22 MED ORDER — ATENOLOL 25 MG PO TABS: 25.0000 mg | ORAL_TABLET | Freq: Every day | ORAL | 3 refills | Status: DC

## 2016-06-22 MED ORDER — HYDROCHLOROTHIAZIDE 25 MG PO TABS: 25.0000 mg | ORAL_TABLET | Freq: Every day | ORAL | 3 refills | Status: DC

## 2016-06-22 MED ORDER — ENALAPRIL MALEATE 20 MG PO TABS: 20.0000 mg | ORAL_TABLET | Freq: Every day | ORAL | 3 refills | Status: DC

## 2016-06-22 NOTE — Progress Notes (Signed)
BP 138/86   Pulse 99   Temp 98.4 F (36.9 C)   Ht 5' 9.2" (1.758 m)   Wt (!) 318 lb (144.2 kg)   BMI 46.69 kg/m    Subjective:    Patient ID: Chloe Nelson, female    DOB: Apr 05, 1961, 55 y.o.   MRN: [id number]  HPI: Chloe Hammanamela A Reap is a 55 y.o. female  Chief Complaint  Patient presents with  . Hypertension   HYPERTENSION Hypertension status: controlled  Satisfied with current treatment? yes Duration of hypertension: chronic BP monitoring frequency:  not checking BP medication side effects:  no Medication compliance: excellent compliance Aspirin: no Recurrent headaches: no Visual changes: no Palpitations: no Dyspnea: no Chest pain: no Lower extremity edema: no Dizzy/lightheaded: no  Relevant past medical, surgical, family and social history reviewed and updated as indicated. Interim medical history since our last visit reviewed. Allergies and medications reviewed and updated.  Review of Systems  Constitutional: Negative.   Respiratory: Negative.   Cardiovascular: Negative.   Psychiatric/Behavioral: Negative.     Per HPI unless specifically indicated above     Objective:    BP 138/86   Pulse 99   Temp 98.4 F (36.9 C)   Ht 5' 9.2" (1.758 m)   Wt (!) 318 lb (144.2 kg)   BMI 46.69 kg/m   Wt Readings from Last 3 Encounters:  06/22/16 (!) 318 lb (144.2 kg)  12/10/15 (!) 312 lb (141.5 kg)  07/30/15 (!) 310 lb 9.6 oz (140.9 kg)    Physical Exam  Constitutional: She is oriented to person, place, and time. She appears well-developed and well-nourished. No distress.  HENT:  Head: Normocephalic and atraumatic.  Right Ear: Hearing normal.  Left Ear: Hearing normal.  Nose: Nose normal.  Eyes: Conjunctivae and lids are normal. Right eye exhibits no discharge. Left eye exhibits no discharge. No scleral icterus.  Cardiovascular: Normal rate, regular rhythm, normal heart sounds and intact distal pulses.  Exam reveals no gallop and no friction rub.   No  murmur heard. Pulmonary/Chest: Effort normal and breath sounds normal. No respiratory distress. She has no wheezes. She has no rales. She exhibits no tenderness.  Musculoskeletal: Normal range of motion.  Neurological: She is alert and oriented to person, place, and time.  Skin: Skin is warm, dry and intact. No rash noted. No erythema. No pallor.  Psychiatric: She has a normal mood and affect. Her speech is normal and behavior is normal. Judgment and thought content normal. Cognition and memory are normal.  Nursing note and vitals reviewed.   Results for orders placed or performed in visit on 12/10/15  Basic metabolic panel  Result Value Ref Range   Glucose 103 (H) 65 - 99 mg/dL   BUN 12 6 - 24 mg/dL   Creatinine, Ser 1.320.70 0.57 - 1.00 mg/dL   GFR calc non Af Amer 99 >59 mL/min/1.73   GFR calc Af Amer 114 >59 mL/min/1.73   BUN/Creatinine Ratio 17 9 - 23   Sodium 140 134 - 144 mmol/L   Potassium 3.8 3.5 - 5.2 mmol/L   Chloride 97 96 - 106 mmol/L   CO2 21 18 - 29 mmol/L   Calcium 9.7 8.7 - 10.2 mg/dL      Assessment & Plan:   Problem List Items Addressed This Visit      Cardiovascular and Mediastinum   Hypertension - Primary    Under good control. Checking labs today. Continue to monitor. Call with concerns.  Relevant Medications   atenolol (TENORMIN) 25 MG tablet   enalapril (VASOTEC) 20 MG tablet   hydrochlorothiazide (HYDRODIURIL) 25 MG tablet   Other Relevant Orders   Comprehensive metabolic panel     Endocrine   IFG (impaired fasting glucose)    Will return fasting for labs. Await results.      Relevant Orders   Comprehensive metabolic panel   Hgb A1c w/o eAG     Other   Hyperlipidemia    Will return fasting for labs. Await results.      Relevant Medications   atenolol (TENORMIN) 25 MG tablet   enalapril (VASOTEC) 20 MG tablet   hydrochlorothiazide (HYDRODIURIL) 25 MG tablet   Other Relevant Orders   Comprehensive metabolic panel   Lipid Panel w/o  Chol/HDL Ratio    Other Visit Diagnoses   None.      Follow up plan: Return in about 6 months (around 12/23/2016).

## 2016-06-22 NOTE — Assessment & Plan Note (Signed)
Will return fasting for labs. Await results.

## 2016-06-22 NOTE — Assessment & Plan Note (Signed)
Under good control. Checking labs today. Continue to monitor. Call with concerns.

## 2016-06-22 NOTE — Assessment & Plan Note (Signed)
Will return fasting for labs. Await results. 

## 2016-06-23 ENCOUNTER — Telehealth: Payer: Self-pay | Admitting: Family Medicine

## 2016-06-23 LAB — COMPREHENSIVE METABOLIC PANEL
A/G RATIO: 1.2 (ref 1.2–2.2)
ALBUMIN: 3.9 g/dL (ref 3.5–5.5)
ALT: 31 IU/L (ref 0–32)
AST: 28 IU/L (ref 0–40)
Alkaline Phosphatase: 88 IU/L (ref 39–117)
BILIRUBIN TOTAL: 0.3 mg/dL (ref 0.0–1.2)
BUN / CREAT RATIO: 16 (ref 9–23)
BUN: 11 mg/dL (ref 6–24)
CALCIUM: 9 mg/dL (ref 8.7–10.2)
CHLORIDE: 97 mmol/L (ref 96–106)
CO2: 25 mmol/L (ref 18–29)
Creatinine, Ser: 0.69 mg/dL (ref 0.57–1.00)
GFR, EST AFRICAN AMERICAN: 113 mL/min/{1.73_m2} (ref >59)
GFR, EST NON AFRICAN AMERICAN: 98 mL/min/{1.73_m2} (ref >59)
Globulin, Total: 3.2 g/dL (ref 1.5–4.5)
Glucose: 116 mg/dL — ABNORMAL HIGH (ref 65–99)
POTASSIUM: 3.7 mmol/L (ref 3.5–5.2)
Sodium: 139 mmol/L (ref 134–144)
TOTAL PROTEIN: 7.1 g/dL (ref 6.0–8.5)

## 2016-06-23 NOTE — Telephone Encounter (Signed)
Please let her know that her labs came back nice and normal. Thanks! 

## 2016-06-26 ENCOUNTER — Other Ambulatory Visit: Payer: Self-pay

## 2016-06-26 DIAGNOSIS — R7301 Impaired fasting glucose: Secondary | ICD-10-CM

## 2016-06-26 DIAGNOSIS — E785 Hyperlipidemia, unspecified: Secondary | ICD-10-CM

## 2016-06-27 LAB — LIPID PANEL W/O CHOL/HDL RATIO
Cholesterol, Total: 223 mg/dL — ABNORMAL HIGH (ref 100–199)
HDL: 40 mg/dL (ref >39)
TRIGLYCERIDES: 408 mg/dL — AB (ref 0–149)

## 2016-06-27 LAB — HGB A1C W/O EAG: HEMOGLOBIN A1C: 6.1 % — AB (ref 4.8–5.6)

## 2016-07-02 ENCOUNTER — Encounter: Payer: Self-pay | Admitting: Family Medicine

## 2016-08-03 ENCOUNTER — Encounter: Payer: Self-pay | Admitting: Family Medicine

## 2016-08-03 ENCOUNTER — Ambulatory Visit (INDEPENDENT_AMBULATORY_CARE_PROVIDER_SITE_OTHER): Payer: Self-pay | Admitting: Family Medicine

## 2016-08-03 VITALS — BP 143/83 | HR 78 | Temp 98.4°F | Wt 309.0 lb

## 2016-08-03 DIAGNOSIS — M25562 Pain in left knee: Secondary | ICD-10-CM

## 2016-08-03 MED ORDER — TRAMADOL HCL 50 MG PO TABS: 50.0000 mg | ORAL_TABLET | Freq: Three times a day (TID) | ORAL | 0 refills | Status: DC | PRN

## 2016-08-03 MED ORDER — PREDNISONE 20 MG PO TABS: 40.0000 mg | ORAL_TABLET | Freq: Every day | ORAL | 0 refills | Status: DC

## 2016-08-03 MED ORDER — CYCLOBENZAPRINE HCL 10 MG PO TABS: 10.0000 mg | ORAL_TABLET | Freq: Three times a day (TID) | ORAL | 0 refills | Status: DC | PRN

## 2016-08-03 NOTE — Patient Instructions (Signed)
Follow up as needed

## 2016-08-03 NOTE — Progress Notes (Signed)
BP (!) 143/83   Pulse 78   Temp 98.4 F (36.9 C)   Wt (!) 309 lb (140.2 kg)   LMP 12/05/2015 (Approximate)   SpO2 97%   BMI 45.37 kg/m    Subjective:    Patient ID: Chloe Nelson, female    DOB: [birth year], 55 y.o.   MRN: [id number]  HPI: Chloe Nelson is a 55 y.o. female  Chief Complaint  Patient presents with  . Leg Injury    left leg, happened Saturday. Stepped off as path and felt something "snap" in the back of her calf. Swelling and pain. Has been trying ice and elevation with no relief.    Patient presents with left leg injury that happened 2 days ago while [place]. States she stepped down off the path to avoid oncoming walkers and felt something "pop" behind her knee and immediately felt sharp severe pain. Now having pain from lower calf up posterior thigh, and can barely stand to bear weight on the left leg. Feels like the knee isn't strong enough to hold her up. Denies joint locking up or loss of ROM. Pain is worst when straightening the leg completely out. Has been taking flexeril and ibuprofen with no relief, and has been icing the knee.   Relevant past medical, surgical, family and social history reviewed and updated as indicated. Interim medical history since our last visit reviewed. Allergies and medications reviewed and updated.  Review of Systems  Constitutional: Negative.   HENT: Negative.   Respiratory: Negative.   Cardiovascular: Negative.   Gastrointestinal: Negative.   Genitourinary: Negative.   Musculoskeletal: Positive for arthralgias, gait problem, joint swelling and myalgias.  Skin: Negative.   Psychiatric/Behavioral: Negative.     Per HPI unless specifically indicated above     Objective:    BP (!) 143/83   Pulse 78   Temp 98.4 F (36.9 C)   Wt (!) 309 lb (140.2 kg)   LMP 12/05/2015 (Approximate)   SpO2 97%   BMI 45.37 kg/m   Wt Readings from Last 3 Encounters:  08/03/16 (!) 309 lb (140.2 kg)    06/22/16 (!) 318 lb (144.2 kg)  12/10/15 (!) 312 lb (141.5 kg)    Physical Exam  Constitutional: She is oriented to person, place, and time. She appears well-developed and well-nourished. No distress.  HENT:  Head: Atraumatic.  Eyes: Conjunctivae are normal. No scleral icterus.  Neck: Normal range of motion. Neck supple.  Cardiovascular: Normal rate and normal heart sounds.   Musculoskeletal:  ROM intact, but pain with full extension of left leg. Knee is nontender to palpation. No bruising or deformity. Some pain with valgus stress. Drawer tests limited by pain and guarding.   Neurological: She is alert and oriented to person, place, and time.  Skin: Skin is warm and dry. No erythema.  Psychiatric: She has a normal mood and affect. Her behavior is normal.  Nursing note and vitals reviewed.     Assessment & Plan:   Problem List Items Addressed This Visit    None    Visit Diagnoses    Posterior left knee pain    -  Primary    Discussed with patient that she will likely need orthopedic referral/soft tissue imaging with concern for ligament injury, but she is self pay and requesting to start very conservative. Will try a prednisone burst, tramadol, and ice/heat/brace/rest. She will follow up toward the end of the week with how  she is feeling.    Follow up plan: Return if symptoms worsen or fail to improve.

## 2016-08-07 ENCOUNTER — Encounter: Payer: Self-pay | Admitting: Family Medicine

## 2016-08-10 ENCOUNTER — Encounter: Payer: Self-pay | Admitting: Family Medicine

## 2016-10-15 ENCOUNTER — Encounter: Payer: Self-pay | Admitting: Family Medicine

## 2016-11-02 ENCOUNTER — Ambulatory Visit (INDEPENDENT_AMBULATORY_CARE_PROVIDER_SITE_OTHER): Payer: Self-pay | Admitting: Family Medicine

## 2016-11-02 ENCOUNTER — Encounter: Payer: Self-pay | Admitting: Family Medicine

## 2016-11-02 VITALS — BP 131/91 | HR 89 | Temp 98.6°F | Wt 309.8 lb

## 2016-11-02 DIAGNOSIS — M25562 Pain in left knee: Secondary | ICD-10-CM

## 2016-11-02 NOTE — Progress Notes (Signed)
BP (!) 131/91 (BP Location: Left Arm, Patient Position: Sitting, Cuff Size: Large)   Pulse 89   Temp 98.6 F (37 C)   Wt (!) 309 lb 12.8 oz (140.5 kg)   SpO2 98%   BMI 45.49 kg/m    Subjective:    Patient ID: Chloe Nelson, female    DOB: [birth year], 56 y.o.   MRN: [id number]  HPI: Chloe Nelson is a 56 y.o. female  Chief Complaint  Patient presents with  . Knee Pain    X 3 months, patient states that she had an injury to her hamstring and since then she has been having trouble with her knee   KNEE PAIN- gait has been off since she hurt her hamstring Duration: 3 months Involved knee: left Mechanism of injury: unknown Location:diffuse Onset: gradual Severity: severe Quality:  aching and stabbing Frequency: constant Radiation: no Aggravating factors: weight bearing, walking, running, stairs and bending  Alleviating factors: nothing  Status: worse Treatments attempted: rest, ice, heat, APAP, ibuprofen and aleve Relief with NSAIDs?:  moderate Weakness with weight bearing or walking: yes Sensation of giving way: yes Locking: no Popping: yes Bruising: no Swelling: yes Redness: no Paresthesias/decreased sensation: no Fevers: no   Relevant past medical, surgical, family and social history reviewed and updated as indicated. Interim medical history since our last visit reviewed. Allergies and medications reviewed and updated.  Review of Systems  Constitutional: Negative.   Respiratory: Negative.   Cardiovascular: Negative.   Musculoskeletal: Positive for arthralgias and gait problem. Negative for back pain, joint swelling, myalgias, neck pain and neck stiffness.  Psychiatric/Behavioral: Negative.     Per HPI unless specifically indicated above     Objective:    BP (!) 131/91 (BP Location: Left Arm, Patient Position: Sitting, Cuff Size: Large)   Pulse 89   Temp 98.6 F (37 C)   Wt (!) 309 lb 12.8 oz (140.5 kg)   SpO2 98%   BMI 45.49 kg/m   Wt Readings  from Last 3 Encounters:  11/02/16 (!) 309 lb 12.8 oz (140.5 kg)  08/03/16 (!) 309 lb (140.2 kg)  06/22/16 (!) 318 lb (144.2 kg)    Physical Exam  Constitutional: She is oriented to person, place, and time. She appears well-developed and well-nourished. No distress.  HENT:  Head: Normocephalic and atraumatic.  Right Ear: Hearing normal.  Left Ear: Hearing normal.  Nose: Nose normal.  Eyes: Conjunctivae and lids are normal. Right eye exhibits no discharge. Left eye exhibits no discharge. No scleral icterus.  Pulmonary/Chest: Effort normal. No respiratory distress.  Musculoskeletal: Normal range of motion.  Tenderness along joint line, + lachman's, negative appley's compression and distraction, Negative McMurrays  Neurological: She is alert and oriented to person, place, and time.  Skin: Skin is intact. No rash noted.  Psychiatric: She has a normal mood and affect. Her speech is normal and behavior is normal. Judgment and thought content normal. Cognition and memory are normal.    Results for orders placed or performed in visit on 06/26/16  Hgb A1c w/o eAG  Result Value Ref Range   Hgb A1c MFr Bld 6.1 (H) 4.8 - 5.6 %  Lipid Panel w/o Chol/HDL Ratio  Result Value Ref Range   Cholesterol, Total 223 (H) 100 - 199 mg/dL   Triglycerides 409 (H) 0 - 149 mg/dL   HDL 40 >81 mg/dL   VLDL Cholesterol Cal Comment 5 - 40 mg/dL   LDL Calculated Comment 0 - 99 mg/dL  Assessment & Plan:   Problem List Items Addressed This Visit    None    Visit Diagnoses    Acute pain of left knee    -  Primary   Cannot afford MRI right now. Will try steroid shot as she has failed conservative treatment. If not improving, need to consider MRI.      Procedure: Left  Knee Intraarticular Steroid Injection        Diagnosis:   ICD-9-CM ICD-10-CM   1. Acute pain of left knee 719.46 M25.562    Cannot afford MRI right now. Will try steroid shot as she has failed conservative treatment. If not improving,  need to consider MRI.    Physician: MJ Consent:  Risks, benefits, and alternative treatments discussed and all questions were answered.  Patient elected to proceed and verbal consent obtained.  Description: Area prepped and draped using  semi-sterile technique.  Using a anterior/lateral approach, a mixture of 2 cc of  1% lidocaine & 1 cc of Kenalog 40 was injected into knee joint.  A bandage was then placed over the injection site. Complications: none Post Procedure Instructions: Wound care instructions discussed and patient was instructed to keep area clean and dry.  Signs and symptoms of infection discussed, patient agrees to contact the office ASAP should they occur.   Follow up plan: Return if symptoms worsen or fail to improve.

## 2016-12-23 ENCOUNTER — Ambulatory Visit (INDEPENDENT_AMBULATORY_CARE_PROVIDER_SITE_OTHER): Payer: Self-pay | Admitting: Family Medicine

## 2016-12-23 ENCOUNTER — Encounter: Payer: Self-pay | Admitting: Family Medicine

## 2016-12-23 VITALS — BP 117/78 | HR 92 | Temp 98.4°F | Resp 17 | Ht 69.2 in | Wt 302.0 lb

## 2016-12-23 DIAGNOSIS — R7301 Impaired fasting glucose: Secondary | ICD-10-CM

## 2016-12-23 DIAGNOSIS — E782 Mixed hyperlipidemia: Secondary | ICD-10-CM

## 2016-12-23 DIAGNOSIS — I10 Essential (primary) hypertension: Secondary | ICD-10-CM

## 2016-12-23 LAB — HGB A1C W/O EAG: Hgb A1c MFr Bld: 6.1 % — ABNORMAL HIGH (ref 4.8–5.6)

## 2016-12-23 LAB — MICROALBUMIN, URINE WAIVED
CREATININE, URINE WAIVED: 200 mg/dL (ref 10–300)
MICROALB, UR WAIVED: 30 mg/L — AB (ref 0–19)

## 2016-12-23 MED ORDER — ENALAPRIL MALEATE 20 MG PO TABS: 20.0000 mg | ORAL_TABLET | Freq: Every day | ORAL | 3 refills | Status: DC

## 2016-12-23 MED ORDER — ATENOLOL 25 MG PO TABS: 25.0000 mg | ORAL_TABLET | Freq: Every day | ORAL | 3 refills | Status: DC

## 2016-12-23 MED ORDER — HYDROCHLOROTHIAZIDE 25 MG PO TABS: 25.0000 mg | ORAL_TABLET | Freq: Every day | ORAL | 3 refills | Status: DC

## 2016-12-23 NOTE — Assessment & Plan Note (Signed)
Continue diet and exercise. Call with any concerns.  

## 2016-12-23 NOTE — Progress Notes (Signed)
BP 117/78 (BP Location: Left Arm, Patient Position: Sitting, Cuff Size: Large)   Pulse 92   Temp 98.4 F (36.9 C) (Oral)   Resp 17   Ht 5' 9.2" (1.758 m)   Wt (!) 302 lb (137 kg)   LMP 11/22/2016 (Approximate)   SpO2 98%   BMI 44.34 kg/m    Subjective:    Patient ID: Chloe Nelson, female    DOB: January 03, 1961, 56 y.o.   MRN: [id number]  HPI: Chloe Nelson is a 56 y.o. female  Chief Complaint  Patient presents with  . Hypertension    6 month f/u   HYPERTENSION / HYPERLIPIDEMIA Satisfied with current treatment? yes Duration of hypertension: chronic BP monitoring frequency: not checking BP range:  BP medication side effects: no Past BP meds: atenolol, enlalapril. HCTZ Duration of hyperlipidemia: chronic Cholesterol medication side effects: Not on anything Cholesterol supplements: none Past cholesterol medications: none Medication compliance: excellent compliance Aspirin: no Recent stressors: no Recurrent headaches: no Visual changes: no Palpitations: no Dyspnea: no Chest pain: no Lower extremity edema: no Dizzy/lightheaded: no  Impaired Fasting Glucose HbA1C:  Lab Results  Component Value Date   HGBA1C 6.1 (H) 06/26/2016   Duration of elevated blood sugar: months Polydipsia: no Polyuria: no Weight change: yes- has lost 27 lbs! With portion control and exercise Visual disturbance: no Glucose Monitoring: no Diabetic Education: Not Completed Family history of diabetes: yes   Relevant past medical, surgical, family and social history reviewed and updated as indicated. Interim medical history since our last visit reviewed. Allergies and medications reviewed and updated.  Review of Systems  Constitutional: Negative.   Respiratory: Negative.   Cardiovascular: Negative.   Psychiatric/Behavioral: Negative.     Per HPI unless specifically indicated above     Objective:    BP 117/78 (BP Location: Left Arm, Patient Position: Sitting, Cuff Size: Large)    Pulse 92   Temp 98.4 F (36.9 C) (Oral)   Resp 17   Ht 5' 9.2" (1.758 m)   Wt (!) 302 lb (137 kg)   LMP 11/22/2016 (Approximate)   SpO2 98%   BMI 44.34 kg/m   Wt Readings from Last 3 Encounters:  12/23/16 (!) 302 lb (137 kg)  11/02/16 (!) 309 lb 12.8 oz (140.5 kg)  08/03/16 (!) 309 lb (140.2 kg)    Physical Exam  Constitutional: She is oriented to person, place, and time. She appears well-developed and well-nourished. No distress.  HENT:  Head: Normocephalic and atraumatic.  Right Ear: Hearing normal.  Left Ear: Hearing normal.  Nose: Nose normal.  Eyes: Conjunctivae and lids are normal. Right eye exhibits no discharge. Left eye exhibits no discharge. No scleral icterus.  Cardiovascular: Normal rate, regular rhythm, normal heart sounds and intact distal pulses.  Exam reveals no gallop and no friction rub.   No murmur heard. Pulmonary/Chest: Effort normal and breath sounds normal. No respiratory distress. She has no wheezes. She has no rales. She exhibits no tenderness.  Musculoskeletal: Normal range of motion.  Neurological: She is alert and oriented to person, place, and time.  Skin: Skin is warm, dry and intact. No rash noted. She is not diaphoretic. No erythema. No pallor.  Psychiatric: She has a normal mood and affect. Her speech is normal and behavior is normal. Judgment and thought content normal. Cognition and memory are normal.  Nursing note and vitals reviewed.   Results for orders placed or performed in visit on 06/26/16  Hgb A1c w/o eAG  Result  Value Ref Range   Hgb A1c MFr Bld 6.1 (H) 4.8 - 5.6 %  Lipid Panel w/o Chol/HDL Ratio  Result Value Ref Range   Cholesterol, Total 223 (H) 100 - 199 mg/dL   Triglycerides 604 (H) 0 - 149 mg/dL   HDL 40 >54 mg/dL   VLDL Cholesterol Cal Comment 5 - 40 mg/dL   LDL Calculated Comment 0 - 99 mg/dL      Assessment & Plan:   Problem List Items Addressed This Visit      Cardiovascular and Mediastinum   Hypertension -  Primary    Under good control. Continue current regimen. Continue to monitor.       Relevant Medications   hydrochlorothiazide (HYDRODIURIL) 25 MG tablet   enalapril (VASOTEC) 20 MG tablet   atenolol (TENORMIN) 25 MG tablet   Other Relevant Orders   Comprehensive metabolic panel   Microalbumin, Urine Waived     Endocrine   IFG (impaired fasting glucose)    Rechecking A1c today. Await results.       Relevant Orders   Comprehensive metabolic panel   Hgb A1c w/o eAG   Microalbumin, Urine Waived     Other   Morbid obesity (HCC)    Congratulated patient on her weight loss. Continue diet and exercise.       Hyperlipidemia    Continue diet and exercise. Call with any concerns.       Relevant Medications   hydrochlorothiazide (HYDRODIURIL) 25 MG tablet   enalapril (VASOTEC) 20 MG tablet   atenolol (TENORMIN) 25 MG tablet   Other Relevant Orders   Comprehensive metabolic panel       Follow up plan: Return in about 6 months (around 06/22/2017) for FOllow up BP/Chol/IFG.

## 2016-12-23 NOTE — Assessment & Plan Note (Signed)
Under good control. Continue current regimen. Continue to monitor.  

## 2016-12-23 NOTE — Assessment & Plan Note (Signed)
Rechecking A1c today. Await results. 

## 2016-12-23 NOTE — Assessment & Plan Note (Signed)
Congratulated patient on her weight loss. Continue diet and exercise.

## 2016-12-24 LAB — COMPREHENSIVE METABOLIC PANEL
A/G RATIO: 1.4 (ref 1.2–2.2)
ALBUMIN: 4.1 g/dL (ref 3.5–5.5)
ALT: 21 IU/L (ref 0–32)
AST: 18 IU/L (ref 0–40)
Alkaline Phosphatase: 89 IU/L (ref 39–117)
BILIRUBIN TOTAL: 0.4 mg/dL (ref 0.0–1.2)
BUN / CREAT RATIO: 13 (ref 9–23)
BUN: 9 mg/dL (ref 6–24)
CHLORIDE: 96 mmol/L (ref 96–106)
CO2: 28 mmol/L (ref 18–29)
Calcium: 9.4 mg/dL (ref 8.7–10.2)
Creatinine, Ser: 0.69 mg/dL (ref 0.57–1.00)
GFR calc non Af Amer: 98 mL/min/{1.73_m2} (ref >59)
GFR, EST AFRICAN AMERICAN: 113 mL/min/{1.73_m2} (ref >59)
Globulin, Total: 3 g/dL (ref 1.5–4.5)
Glucose: 139 mg/dL — ABNORMAL HIGH (ref 65–99)
POTASSIUM: 3.6 mmol/L (ref 3.5–5.2)
Sodium: 140 mmol/L (ref 134–144)
TOTAL PROTEIN: 7.1 g/dL (ref 6.0–8.5)

## 2016-12-24 LAB — HGB A1C W/O EAG: Hgb A1c MFr Bld: 5.6 % (ref 4.8–5.6)

## 2016-12-24 LAB — PLEASE NOTE

## 2017-01-07 ENCOUNTER — Ambulatory Visit (INDEPENDENT_AMBULATORY_CARE_PROVIDER_SITE_OTHER): Payer: Self-pay | Admitting: Family Medicine

## 2017-01-07 ENCOUNTER — Encounter: Payer: Self-pay | Admitting: Family Medicine

## 2017-01-07 VITALS — BP 122/84 | HR 102 | Ht 70.0 in | Wt 303.0 lb

## 2017-01-07 DIAGNOSIS — L03012 Cellulitis of left finger: Secondary | ICD-10-CM

## 2017-01-07 MED ORDER — SULFAMETHOXAZOLE-TRIMETHOPRIM 800-160 MG PO TABS: 1.0000 | ORAL_TABLET | Freq: Two times a day (BID) | ORAL | 0 refills | Status: DC

## 2017-01-07 NOTE — Progress Notes (Signed)
BP 122/84   Pulse (!) 102   Ht 5\' 10"  (1.778 m)   Wt (!) 303 lb (137.4 kg)   SpO2 99%   BMI 43.48 kg/m    Subjective:    Patient ID: Chloe HammanPamela A Nelson, female    DOB: July 17, 1961, 56 y.o.   MRN: [id number]  HPI: Chloe Hammanamela A Arts is a 56 y.o. female  Chief Complaint  Patient presents with  . Nail Problem    Left hand middle finger   FINGER PAIN Duration: Couple of days Involved finger: left3rd  Mechanism of injury: trauma Onset: sudden Severity: 10/10  Quality: sharp, aching, severe Frequency: constant Radiation: no Aggravating factors: touching it   Alleviating factors: nothing   Status: worse Treatments attempted: nothing  Relief with NSAIDs?: No NSAIDs Taken Morning stiffness: no Redness: yes  Bruising: no Swelling: yes Paresthesias / decreased sensation: no Fevers: no  Relevant past medical, surgical, family and social history reviewed and updated as indicated. Interim medical history since our last visit reviewed. Allergies and medications reviewed and updated.  Review of Systems  Constitutional: Negative.   Respiratory: Negative.   Cardiovascular: Negative.   Skin: Positive for color change and wound. Negative for pallor and rash.  Psychiatric/Behavioral: Negative.     Per HPI unless specifically indicated above     Objective:    BP 122/84   Pulse (!) 102   Ht 5\' 10"  (1.778 m)   Wt (!) 303 lb (137.4 kg)   SpO2 99%   BMI 43.48 kg/m   Wt Readings from Last 3 Encounters:  01/07/17 (!) 303 lb (137.4 kg)  12/23/16 (!) 302 lb (137 kg)  11/02/16 (!) 309 lb 12.8 oz (140.5 kg)    Physical Exam  Constitutional: She is oriented to person, place, and time. She appears well-developed and well-nourished. No distress.  HENT:  Head: Normocephalic and atraumatic.  Right Ear: Hearing normal.  Left Ear: Hearing normal.  Nose: Nose normal.  Eyes: Conjunctivae and lids are normal. Right eye exhibits no discharge. Left eye exhibits no discharge. No scleral  icterus.  Pulmonary/Chest: Effort normal. No respiratory distress.  Musculoskeletal: Normal range of motion.  Neurological: She is alert and oriented to person, place, and time.  Skin: Skin is warm, dry and intact. No rash noted. There is erythema. No pallor.  Very painful, swollen erythematous L middle finger  Psychiatric: She has a normal mood and affect. Her speech is normal and behavior is normal. Judgment and thought content normal. Cognition and memory are normal.    Results for orders placed or performed in visit on 12/23/16  Comprehensive metabolic panel  Result Value Ref Range   Glucose 139 (H) 65 - 99 mg/dL   BUN 9 6 - 24 mg/dL   Creatinine, Ser 1.190.69 0.57 - 1.00 mg/dL   GFR calc non Af Amer 98 >59 mL/min/1.73   GFR calc Af Amer 113 >59 mL/min/1.73   BUN/Creatinine Ratio 13 9 - 23   Sodium 140 134 - 144 mmol/L   Potassium 3.6 3.5 - 5.2 mmol/L   Chloride 96 96 - 106 mmol/L   CO2 28 18 - 29 mmol/L   Calcium 9.4 8.7 - 10.2 mg/dL   Total Protein 7.1 6.0 - 8.5 g/dL   Albumin 4.1 3.5 - 5.5 g/dL   Globulin, Total 3.0 1.5 - 4.5 g/dL   Albumin/Globulin Ratio 1.4 1.2 - 2.2   Bilirubin Total 0.4 0.0 - 1.2 mg/dL   Alkaline Phosphatase 89 39 - 117 IU/L  AST 18 0 - 40 IU/L   ALT 21 0 - 32 IU/L  Hgb A1c w/o eAG  Result Value Ref Range   Hgb A1c MFr Bld 6.1 (H) 4.8 - 5.6 %  Microalbumin, Urine Waived  Result Value Ref Range   Microalb, Ur Waived 30 (H) 0 - 19 mg/L   Creatinine, Urine Waived 200 10 - 300 mg/dL   Microalb/Creat Ratio <30 <30 mg/g  Hgb A1c w/o eAG  Result Value Ref Range   Hgb A1c MFr Bld 5.6 4.8 - 5.6 %  Please Note  Result Value Ref Range   Please note Comment       Assessment & Plan:   Problem List Items Addressed This Visit    None    Visit Diagnoses    Paronychia of finger, left    -  Primary   Paronychia I&D'd today as below. Will treat with bactrim. Call if not getting better or getting worse.    Relevant Medications    sulfamethoxazole-trimethoprim (BACTRIM DS,SEPTRA DS) 800-160 MG tablet      Procedure: Paronychia I&D Diagnosis:    ICD-9-CM ICD-10-CM   1. Paronychia of finger, left 681.02 L03.012    Paronychia I&D'd today as below. Will treat with bactrim. Call if not getting better or getting worse.    Physican: Olevia Perches, DO Consent: Risks, benefits, and alternative treatments discussed and all questions were answered.  Patient elected to proceed and verbal consent obtained Description:  Area prepped and draped using  sterile technique. Digital block of the  Left  3rd toe performed by injecting local anesthetic at the base of the Finger at the 2 oclock and 10 oclock positions, using 5.5 cc's of  1% lidocaine plain. After confirming adequate anesthesia, Granulation tissue was removed from the outer corner and incision was made with #15 blade scalpel. Scant serous fluid was expressed. Area where granulation tissue was treated with silver nitrate. Bacitracin ointment was applied to the operative site a circumferential gauze dressive was applied.  The patient tolerated the procedure well.  Complications: none Estimated Blood Loss: minimal Post Procedure Instructions: The patient was encouraged to keep the dressing in place for 24 hours and keep the hand elevated as much as possible during this time.  After the first day they are instructed to soak the toe in warm water 3 times daily for 3-4 days.  Antibiotic ointment is to be applied daily for 1 week.  The patient was informed that some oozing is to be expected for 1-2 weeks but that they should return immediately for pus, increased pain or redness.  They were instructed to take APAP or motrin as needed for post operative discomfort.   Follow up plan: No Follow-up on file.

## 2017-01-07 NOTE — Patient Instructions (Addendum)
Paronychia Paronychia is an infection of the skin. It happens near a fingernail or toenail. It may cause pain and swelling around the nail. Usually, it is not serious and it clears up with treatment. Follow these instructions at home:  Soak the fingers or toes in warm water as told by your doctor. You may be told to do this for 20 minutes, 2-3 times a day.  Keep the area dry when you are not soaking it.  Take medicines only as told by your doctor.  If you were given an antibiotic medicine, finish all of it even if you start to feel better.  Keep the affected area clean.  Do not try to drain a fluid-filled bump yourself.  Wear rubber gloves when putting your hands in water.  Wear gloves if your hands might touch cleaners or chemicals.  Follow your doctor's instructions about:  Wound care.  Bandage (dressing) changes and removal. Contact a doctor if:  Your symptoms get worse or do not improve.  You have a fever or chills.  You have redness spreading from the affected area.  You have more fluid, blood, or pus coming from the affected area.  Your finger or knuckle is swollen or is hard to move. This information is not intended to replace advice given to you by your health care provider. Make sure you discuss any questions you have with your health care provider. Document Released: 09/30/2009 Document Revised: 03/19/2016 Document Reviewed: 09/19/2014 Elsevier Interactive Patient Education  2017 [employer] Inc[employer]  

## 2017-01-11 ENCOUNTER — Encounter: Payer: Self-pay | Admitting: Family Medicine

## 2017-03-11 ENCOUNTER — Ambulatory Visit: Payer: Self-pay | Admitting: Family Medicine

## 2017-03-15 ENCOUNTER — Encounter: Payer: Self-pay | Admitting: Family Medicine

## 2017-03-15 NOTE — Telephone Encounter (Signed)
Please advise. It is listed under past appointments that it was NO SHOWED, there is a note under "notes" that "Pt stated she spoke to someone and cancelled"

## 2017-09-20 ENCOUNTER — Encounter: Payer: Self-pay | Admitting: Family Medicine

## 2017-09-20 ENCOUNTER — Ambulatory Visit
Admission: RE | Admit: 2017-09-20 | Discharge: 2017-09-20 | Disposition: A | Discharge status: Home / Self Care | Payer: Self-pay | Source: Ambulatory Visit | Attending: Family Medicine | Admitting: Family Medicine

## 2017-09-20 ENCOUNTER — Ambulatory Visit (INDEPENDENT_AMBULATORY_CARE_PROVIDER_SITE_OTHER): Payer: Self-pay | Admitting: Family Medicine

## 2017-09-20 VITALS — BP 124/77 | HR 80 | Temp 98.4°F | Wt 310.5 lb

## 2017-09-20 DIAGNOSIS — M25571 Pain in right ankle and joints of right foot: Secondary | ICD-10-CM

## 2017-09-20 DIAGNOSIS — M799 Soft tissue disorder, unspecified: Secondary | ICD-10-CM | POA: Insufficient documentation

## 2017-09-20 MED ORDER — PREDNISONE 20 MG PO TABS
40.0000 mg | ORAL_TABLET | Freq: Every day | ORAL | 0 refills | Status: DC
Start: 2017-09-20 — End: 2017-12-28

## 2017-09-20 MED ORDER — TRAMADOL HCL 50 MG PO TABS: 50.0000 mg | ORAL_TABLET | Freq: Three times a day (TID) | ORAL | 0 refills | Status: DC | PRN

## 2017-09-20 NOTE — Patient Instructions (Signed)
Follow up if no improvement 

## 2017-09-20 NOTE — Progress Notes (Signed)
   BP 124/77 (BP Location: Right Wrist, Patient Position: Sitting, Cuff Size: Normal)   Pulse 80   Temp 98.4 F (36.9 C) (Oral)   Wt (!) 310 lb 8 oz (140.8 kg)   LMP 11/22/2016 (Approximate)   SpO2 99%   BMI 44.55 kg/m    Subjective:    Patient ID: Chloe Nelson, female    DOB: 10/11/1961, 56 y.o.   MRN: [id number]  HPI: Chloe Nelson is a 56 y.o. female  Chief Complaint  Patient presents with  . Foot Pain    Right foot. x's 5 weeks. Went to field trip w/ son. And that's when it started. Pain around ankle, and under arch.    Patient here today with right foot/ankle pain and swelling for about 5 weeks now. Seems to have started up after a field trip with [place] in [place] where she did much more walking than she is used to and on cobblestone roads. Has been using ice, compression wraps, boots for support which helps temporarily. Ibuprofen and tylenol don't touch the pain. Pain is all the time, regardless of if weight bearing or not.   Relevant past medical, surgical, family and social history reviewed and updated as indicated. Interim medical history since our last visit reviewed. Allergies and medications reviewed and updated.  Review of Systems  Constitutional: Negative.   Respiratory: Negative.   Cardiovascular: Negative.   Gastrointestinal: Negative.   Genitourinary: Negative.   Musculoskeletal: Positive for arthralgias and joint swelling.  Neurological: Negative.   Psychiatric/Behavioral: Negative.     Per HPI unless specifically indicated above     Objective:    BP 124/77 (BP Location: Right Wrist, Patient Position: Sitting, Cuff Size: Normal)   Pulse 80   Temp 98.4 F (36.9 C) (Oral)   Wt (!) 310 lb 8 oz (140.8 kg)   LMP 11/22/2016 (Approximate)   SpO2 99%   BMI 44.55 kg/m   Wt Readings from Last 3 Encounters:  09/20/17 (!) 310 lb 8 oz (140.8 kg)  01/07/17 (!) 303 lb (137.4 kg)  12/23/16 (!) 302 lb (137 kg)    Physical Exam  Constitutional: She  is oriented to person, place, and time. She appears well-developed and well-nourished. No distress.  HENT:  Head: Atraumatic.  Eyes: Conjunctivae are normal. Pupils are equal, round, and reactive to light. No scleral icterus.  Neck: Normal range of motion. Neck supple.  Cardiovascular: Normal rate and normal heart sounds.  Pulmonary/Chest: Effort normal and breath sounds normal. No respiratory distress.  Musculoskeletal: Normal range of motion. She exhibits edema (right ankle into dorsal foot, worst medially) and tenderness (right ankle). She exhibits no deformity.  Lymphadenopathy:    She has no cervical adenopathy.  Neurological: She is alert and oriented to person, place, and time.  Neurovascularly intact b/l LEs  Skin: Skin is warm and dry.  Psychiatric: She has a normal mood and affect. Her behavior is normal.  Nursing note and vitals reviewed.     Assessment & Plan:   Problem List Items Addressed This Visit    None    Visit Diagnoses    Acute right ankle pain    -  Primary   Await x-ray results, continue RICE protocol in meantime. prednisone and tramadol, precautions regarding sedation and addictive potential reviewed   Relevant Orders   DG Ankle Complete Right       Follow up plan: Return if symptoms worsen or fail to improve.

## 2017-09-21 ENCOUNTER — Telehealth: Payer: Self-pay | Admitting: Family Medicine

## 2017-09-21 NOTE — Telephone Encounter (Signed)
Copied from CRM [id number]. Topic: Inquiry >> Sep 21, 2017  1:13 PM Cipriano BunkerLambe, Annette S wrote: Reason for CRM:  patient called about results of xray

## 2017-09-22 NOTE — Telephone Encounter (Signed)
Patient notified

## 2017-09-22 NOTE — Telephone Encounter (Signed)
Had released results via mychart:  Notes recorded by Particia NearingLane, Dorothee Napierkowski Elizabeth, PA-C on 09/20/2017 at 4:38 PM EST There were no fractures noted in your x-ray, just some arthritis and the swelling that we are seeing externally. Lets continue as planned and keep me posted on how things feel once you finish that course.

## 2017-12-27 ENCOUNTER — Ambulatory Visit: Payer: Self-pay | Admitting: Family Medicine

## 2017-12-28 ENCOUNTER — Encounter: Payer: Self-pay | Admitting: Family Medicine

## 2017-12-28 ENCOUNTER — Ambulatory Visit (INDEPENDENT_AMBULATORY_CARE_PROVIDER_SITE_OTHER): Payer: Self-pay | Admitting: Family Medicine

## 2017-12-28 VITALS — BP 107/71 | HR 68 | Temp 98.4°F | Wt 286.6 lb

## 2017-12-28 DIAGNOSIS — I952 Hypotension due to drugs: Secondary | ICD-10-CM

## 2017-12-28 DIAGNOSIS — I10 Essential (primary) hypertension: Secondary | ICD-10-CM

## 2017-12-28 MED ORDER — ENALAPRIL MALEATE 20 MG PO TABS: 10.0000 mg | ORAL_TABLET | Freq: Every day | ORAL | 3 refills | Status: DC

## 2017-12-28 NOTE — Assessment & Plan Note (Signed)
Congratulated patient on her weight loss. Will stop HCTZ and cut enlalapril in 1/2. Recheck 1-2 months with blood work. Call with any concerns.

## 2017-12-28 NOTE — Progress Notes (Signed)
BP 107/71 (BP Location: Left Arm, Patient Position: Sitting, Cuff Size: Large)   Pulse 68   Temp 98.4 F (36.9 C)   Wt 286 lb 9 oz (130 kg)   LMP 11/22/2016 (Approximate)   SpO2 98%   BMI 41.12 kg/m    Subjective:    Patient ID: Chloe Nelson, female    DOB: 1961-03-01, 57 y.o.   MRN: [id number]  HPI: Chloe Nelson is a 57 y.o. female  Chief Complaint  Patient presents with  . Hypertension    Patient states that she has lost 40 pounds, and she thinks that her BP may be low now, when she takes her second dose in the afternoon she feels sluggish and nauseous   HYPERTENSION Hypertension status: over treated  Satisfied with current treatment? no Duration of hypertension: chronic BP monitoring frequency:  a few times a week BP medication side effects:  no Medication compliance: excellent compliance Previous BP meds: atenolol, enlalapril, hctz Aspirin: no Recurrent headaches: no Visual changes: no Palpitations: no Dyspnea: no Chest pain: no Lower extremity edema: no Dizzy/lightheaded: no  Relevant past medical, surgical, family and social history reviewed and updated as indicated. Interim medical history since our last visit reviewed. Allergies and medications reviewed and updated.  Review of Systems  Constitutional: Negative.   Respiratory: Negative.   Cardiovascular: Negative.   Psychiatric/Behavioral: Negative.     Per HPI unless specifically indicated above     Objective:    BP 107/71 (BP Location: Left Arm, Patient Position: Sitting, Cuff Size: Large)   Pulse 68   Temp 98.4 F (36.9 C)   Wt 286 lb 9 oz (130 kg)   LMP 11/22/2016 (Approximate)   SpO2 98%   BMI 41.12 kg/m   Wt Readings from Last 3 Encounters:  12/28/17 286 lb 9 oz (130 kg)  09/20/17 (!) 310 lb 8 oz (140.8 kg)  01/07/17 (!) 303 lb (137.4 kg)    Physical Exam  Constitutional: She is oriented to person, place, and time. She appears well-developed and well-nourished. No distress.    HENT:  Head: Normocephalic and atraumatic.  Right Ear: Hearing normal.  Left Ear: Hearing normal.  Nose: Nose normal.  Eyes: Conjunctivae and lids are normal. Right eye exhibits no discharge. Left eye exhibits no discharge. No scleral icterus.  Cardiovascular: Normal rate, regular rhythm, normal heart sounds and intact distal pulses. Exam reveals no gallop and no friction rub.  No murmur heard. Pulmonary/Chest: Effort normal and breath sounds normal. No respiratory distress. She has no wheezes. She has no rales. She exhibits no tenderness.  Musculoskeletal: Normal range of motion.  Neurological: She is alert and oriented to person, place, and time.  Skin: Skin is warm, dry and intact. No rash noted. She is not diaphoretic. No erythema. No pallor.  Psychiatric: She has a normal mood and affect. Her speech is normal and behavior is normal. Judgment and thought content normal. Cognition and memory are normal.  Nursing note and vitals reviewed.   Results for orders placed or performed in visit on 12/23/16  Comprehensive metabolic panel  Result Value Ref Range   Glucose 139 (H) 65 - 99 mg/dL   BUN 9 6 - 24 mg/dL   Creatinine, Ser 9.62 0.57 - 1.00 mg/dL   GFR calc non Af Amer 98 >59 mL/min/1.73   GFR calc Af Amer 113 >59 mL/min/1.73   BUN/Creatinine Ratio 13 9 - 23   Sodium 140 134 - 144 mmol/L   Potassium 3.6  3.5 - 5.2 mmol/L   Chloride 96 96 - 106 mmol/L   CO2 28 18 - 29 mmol/L   Calcium 9.4 8.7 - 10.2 mg/dL   Total Protein 7.1 6.0 - 8.5 g/dL   Albumin 4.1 3.5 - 5.5 g/dL   Globulin, Total 3.0 1.5 - 4.5 g/dL   Albumin/Globulin Ratio 1.4 1.2 - 2.2   Bilirubin Total 0.4 0.0 - 1.2 mg/dL   Alkaline Phosphatase 89 39 - 117 IU/L   AST 18 0 - 40 IU/L   ALT 21 0 - 32 IU/L  Hgb A1c w/o eAG  Result Value Ref Range   Hgb A1c MFr Bld 6.1 (H) 4.8 - 5.6 %  Microalbumin, Urine Waived  Result Value Ref Range   Microalb, Ur Waived 30 (H) 0 - 19 mg/L   Creatinine, Urine Waived 200 10 - 300  mg/dL   Microalb/Creat Ratio <30 <30 mg/g  Hgb A1c w/o eAG  Result Value Ref Range   Hgb A1c MFr Bld 5.6 4.8 - 5.6 %  Please Note  Result Value Ref Range   Please note Comment       Assessment & Plan:   Problem List Items Addressed This Visit      Cardiovascular and Mediastinum   Hypertension    Congratulated patient on her weight loss. Will stop HCTZ and cut enlalapril in 1/2. Recheck 1-2 months with blood work. Call with any concerns.       Relevant Medications   enalapril (VASOTEC) 20 MG tablet    Other Visit Diagnoses    Hypotension due to drugs    -  Primary   Congratulated patient on her weight loss. Will stop HCTZ and cut enlalapril in 1/2. Recheck 1-2 months with blood work. Call with any concerns.    Relevant Medications   enalapril (VASOTEC) 20 MG tablet       Follow up plan: Return 1-2 months, for follow up BP.

## 2018-01-19 ENCOUNTER — Encounter: Payer: Self-pay | Admitting: Family Medicine

## 2018-01-20 ENCOUNTER — Other Ambulatory Visit: Payer: Self-pay | Admitting: Family Medicine

## 2018-01-22 ENCOUNTER — Other Ambulatory Visit: Payer: Self-pay | Admitting: Family Medicine

## 2018-01-24 ENCOUNTER — Other Ambulatory Visit: Payer: Self-pay | Admitting: Family Medicine

## 2018-03-10 ENCOUNTER — Other Ambulatory Visit: Payer: Self-pay | Admitting: Family Medicine

## 2018-04-12 ENCOUNTER — Other Ambulatory Visit: Payer: Self-pay | Admitting: Family Medicine

## 2018-04-14 NOTE — Telephone Encounter (Signed)
atenolol refill Last Refill:01/23/18 # 90 3 RF Last OV: 12/28/17 PCP: Olevia PerchesMegan Johnson DO Pharmacy:[facility] Refill requested too soon

## 2018-04-18 ENCOUNTER — Other Ambulatory Visit: Payer: Self-pay

## 2018-04-18 MED ORDER — ATENOLOL 25 MG PO TABS: 25.0000 mg | ORAL_TABLET | Freq: Every day | ORAL | 2 refills | Status: DC

## 2018-04-18 NOTE — Telephone Encounter (Signed)
[facility]?

## 2018-06-03 ENCOUNTER — Telehealth: Payer: Self-pay

## 2018-06-03 ENCOUNTER — Other Ambulatory Visit: Payer: Self-pay | Admitting: Family Medicine

## 2018-06-03 NOTE — Telephone Encounter (Signed)
She had a year supply sent in in March. Please see if she's not using [facility] anymore and if she can get that Rx transferred. Otherwise I'll send it over.

## 2018-06-03 NOTE — Telephone Encounter (Signed)
Appointment cancelled, medication is tolerated well.

## 2018-06-03 NOTE — Telephone Encounter (Signed)
90 with 3 refills sent in on 01/07/18. Should not be due

## 2018-06-03 NOTE — Telephone Encounter (Signed)
Patient called, left VM to return call to schedule a follow up visit as noted by Dr. Laural BenesJohnson last OV note on 12/28/17. Medication last refilled on 12/28/17 90 tab/[facility]. [facility]ill request from [facility].

## 2018-06-03 NOTE — Telephone Encounter (Signed)
This is OK as long as she is tolerating the medicine and not getting dizzy

## 2018-06-03 NOTE — Telephone Encounter (Signed)
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 Would it be ok for her to cancel her appt for the 13th and come in when she has insurance.

## 2018-06-03 NOTE — Telephone Encounter (Signed)
Left message on machine for pt to return call to the office.  

## 2018-06-07 ENCOUNTER — Ambulatory Visit: Payer: Self-pay | Admitting: Family Medicine

## 2018-08-29 ENCOUNTER — Telehealth: Payer: Self-pay | Admitting: Family Medicine

## 2018-08-29 NOTE — Telephone Encounter (Signed)
I am ok to see her for this, but she will need this to be a BP f/u with blood work at minimum - looks like she's adjusted her medicines once or twice and not been checked out since due to loss of coverage

## 2018-08-29 NOTE — Telephone Encounter (Signed)
Could someone from administrative lease reschedule this appointment for a 30 minute slot with Chloe Nelson?

## 2018-08-29 NOTE — Telephone Encounter (Signed)
It depends on the form- can we get her in a 30 minute spot just to be safe, but please let Malinda know I won't do a physical on her unless it's required. Thanks.

## 2018-08-29 NOTE — Telephone Encounter (Signed)
I would check with Fleet Contras if she is comfortable with this.

## 2018-08-29 NOTE — Telephone Encounter (Signed)
Copied from CRM [id number]. Topic: Appointment Scheduling - Scheduling Inquiry for Clinic >> Aug 23, 2018  8:36 AM Adela Ports M wrote: Reason for CRM: pt scheduled an appt through [facility] for health exam but she has not had a cpe and would need a 30 minute appt. >> Aug 26, 2018  2:41 PM Arlyss Gandy, NT wrote: Pt states that what she is needing done is not a physical and that her job does not require this anymore. She just needs a health assessment form completed.  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

## 2018-08-29 NOTE — Telephone Encounter (Signed)
Dr. Laural Benes,  Does it matter the provider. She has scheduled a 15 minute appointment with Fleet Contras 11/11 in a 15 min slot.  Do you want to see patient in a 30 minute slot, and I ran over appointments and have seen no physicals.

## 2018-08-29 NOTE — Telephone Encounter (Signed)
Pt called back and I rescheduled to a 30 minute slot. At 9am same day.

## 2018-09-05 ENCOUNTER — Ambulatory Visit: Payer: Self-pay | Admitting: Family Medicine

## 2018-09-10 ENCOUNTER — Other Ambulatory Visit: Payer: Self-pay | Admitting: Family Medicine

## 2018-09-12 NOTE — Telephone Encounter (Signed)
Courtesy refill until appointment 10/14/18.

## 2018-09-12 NOTE — Telephone Encounter (Signed)
Patient called and asked about which pharmacy she's using, because there are two atenolol with 2 different pharmacies, she says she's using [address] and to remove [facility] from the list.

## 2018-10-24 ENCOUNTER — Ambulatory Visit: Payer: BC Managed Care – PPO | Admitting: Family Medicine

## 2018-10-24 ENCOUNTER — Encounter: Payer: Self-pay | Admitting: Family Medicine

## 2018-10-24 VITALS — BP 122/83 | HR 78 | Temp 98.8°F | Wt 289.8 lb

## 2018-10-24 DIAGNOSIS — R7301 Impaired fasting glucose: Secondary | ICD-10-CM | POA: Diagnosis not present

## 2018-10-24 DIAGNOSIS — F325 Major depressive disorder, single episode, in full remission: Secondary | ICD-10-CM

## 2018-10-24 DIAGNOSIS — E782 Mixed hyperlipidemia: Secondary | ICD-10-CM | POA: Diagnosis not present

## 2018-10-24 DIAGNOSIS — Z23 Encounter for immunization: Secondary | ICD-10-CM

## 2018-10-24 DIAGNOSIS — I10 Essential (primary) hypertension: Secondary | ICD-10-CM | POA: Diagnosis not present

## 2018-10-24 LAB — MICROALBUMIN, URINE WAIVED
CREATININE, URINE WAIVED: 100 mg/dL (ref 10–300)
Microalb, Ur Waived: 10 mg/L (ref 0–19)
Microalb/Creat Ratio: <30 mg/g (ref <30)

## 2018-10-24 LAB — BAYER DCA HB A1C WAIVED: HB A1C (BAYER DCA - WAIVED): 5.5 % (ref <7.0)

## 2018-10-24 MED ORDER — HYDROCHLOROTHIAZIDE 25 MG PO TABS: 25.0000 mg | ORAL_TABLET | Freq: Every day | ORAL | 3 refills | Status: AC

## 2018-10-24 MED ORDER — ENALAPRIL MALEATE 20 MG PO TABS: 10.0000 mg | ORAL_TABLET | Freq: Every day | ORAL | 3 refills | Status: DC

## 2018-10-24 MED ORDER — ATENOLOL 25 MG PO TABS: 25.0000 mg | ORAL_TABLET | Freq: Every day | ORAL | 3 refills | Status: AC

## 2018-10-24 NOTE — Assessment & Plan Note (Signed)
Remains in remission. Off medicine. Doing well.

## 2018-10-24 NOTE — Progress Notes (Signed)
BP 122/83   Pulse 78   Temp 98.8 F (37.1 C) (Oral)   Wt 289 lb 12.8 oz (131.5 kg)   LMP 11/22/2016 (Approximate)   SpO2 96%   BMI 41.58 kg/m    Subjective:    Patient ID: Chloe Nelson, female    DOB: 1960/12/31, 57 y.o.   MRN: [id number]  HPI: Chloe Nelson is a 57 y.o. female  Chief Complaint  Patient presents with  . Hypertension   HYPERTENSION / HYPERLIPIDEMIA Satisfied with current treatment? yes Duration of hypertension: chronic BP monitoring frequency: not checking BP medication side effects: no Past BP meds: enlalapril, hctz, atenolol Duration of hyperlipidemia: chronic Cholesterol medication side effects: Not on anything Cholesterol supplements: none Past cholesterol medications: none Medication compliance: good compliance Aspirin: no Recent stressors: no Recurrent headaches: no Visual changes: no Palpitations: no Dyspnea: no Chest pain: no Lower extremity edema: no Dizzy/lightheaded: no  Impaired Fasting Glucose HbA1C:  Lab Results  Component Value Date   HGBA1C 6.1 (H) 12/23/2016   HGBA1C 5.6 12/23/2016   Duration of elevated blood sugar: chronic Polydipsia: no Polyuria: no Weight change: yes Visual disturbance: no Glucose Monitoring: no    Accucheck frequency: Not Checking  DEPRESSION Mood status: controlled Satisfied with current treatment?: yes- not on anything Symptom severity: mild  Duration of current treatment : Not on anything Medication compliance: Not on antying Depressed mood: no Anxious mood: no Anhedonia: no Significant weight loss or gain: no Insomnia: no  Fatigue: no Feelings of worthlessness or guilt: no Impaired concentration/indecisiveness: no Suicidal ideations: no Hopelessness: no Crying spells: no [facility] screen [facility] 10/24/2018 11/02/2016  Decreased Interest 0 0  Down, Depressed, Hopeless 0 0  PHQ - 2 Score 0 0  Altered sleeping 0 0  Tired, decreased energy 0 0  Change in appetite 0 0  Feeling  bad or failure about yourself  0 0  Trouble concentrating 0 0  Moving slowly or fidgety/restless 0 0  Suicidal thoughts 0 0  PHQ-9 Score 0 0  Difficult doing work/chores Not difficult at all -    Relevant past medical, surgical, family and social history reviewed and updated as indicated. Interim medical history since our last visit reviewed. Allergies and medications reviewed and updated.  Review of Systems  Constitutional: Negative.   Respiratory: Negative.   Cardiovascular: Negative.   Musculoskeletal: Negative.   Psychiatric/Behavioral: Negative.     Per HPI unless specifically indicated above     Objective:    BP 122/83   Pulse 78   Temp 98.8 F (37.1 C) (Oral)   Wt 289 lb 12.8 oz (131.5 kg)   LMP 11/22/2016 (Approximate)   SpO2 96%   BMI 41.58 kg/m   Wt Readings from Last 3 Encounters:  10/24/18 289 lb 12.8 oz (131.5 kg)  12/28/17 286 lb 9 oz (130 kg)  09/20/17 (!) 310 lb 8 oz (140.8 kg)    Physical Exam Vitals signs and nursing note reviewed.  Constitutional:      General: She is not in acute distress.    Appearance: Normal appearance. She is not ill-appearing, toxic-appearing or diaphoretic.  HENT:     Head: Normocephalic and atraumatic.     Right Ear: External ear normal.     Left Ear: External ear normal.     Nose: Nose normal.     Mouth/Throat:     Mouth: Mucous membranes are moist.     Pharynx: Oropharynx is clear.  Eyes:  General: No scleral icterus.       Right eye: No discharge.        Left eye: No discharge.     Extraocular Movements: Extraocular movements intact.     Conjunctiva/sclera: Conjunctivae normal.     Pupils: Pupils are equal, round, and reactive to light.  Neck:     Musculoskeletal: Normal range of motion and neck supple.  Cardiovascular:     Rate and Rhythm: Normal rate and regular rhythm.     Pulses: Normal pulses.     Heart sounds: Normal heart sounds. No murmur. No friction rub. No gallop.   Pulmonary:     Effort:  Pulmonary effort is normal. No respiratory distress.     Breath sounds: Normal breath sounds. No stridor. No wheezing, rhonchi or rales.  Chest:     Chest wall: No tenderness.  Musculoskeletal: Normal range of motion.  Skin:    General: Skin is warm and dry.     Capillary Refill: Capillary refill takes less than 2 seconds.     Coloration: Skin is not jaundiced or pale.     Findings: No bruising, erythema, lesion or rash.  Neurological:     General: No focal deficit present.     Mental Status: She is alert and oriented to person, place, and time. Mental status is at baseline.  Psychiatric:        Mood and Affect: Mood normal.        Behavior: Behavior normal.        Thought Content: Thought content normal.        Judgment: Judgment normal.     Results for orders placed or performed in visit on 12/23/16  Comprehensive metabolic panel  Result Value Ref Range   Glucose 139 (H) 65 - 99 mg/dL   BUN 9 6 - 24 mg/dL   Creatinine, Ser 1.61 0.57 - 1.00 mg/dL   GFR calc non Af Amer 98 >59 mL/min/1.73   GFR calc Af Amer 113 >59 mL/min/1.73   BUN/Creatinine Ratio 13 9 - 23   Sodium 140 134 - 144 mmol/L   Potassium 3.6 3.5 - 5.2 mmol/L   Chloride 96 96 - 106 mmol/L   CO2 28 18 - 29 mmol/L   Calcium 9.4 8.7 - 10.2 mg/dL   Total Protein 7.1 6.0 - 8.5 g/dL   Albumin 4.1 3.5 - 5.5 g/dL   Globulin, Total 3.0 1.5 - 4.5 g/dL   Albumin/Globulin Ratio 1.4 1.2 - 2.2   Bilirubin Total 0.4 0.0 - 1.2 mg/dL   Alkaline Phosphatase 89 39 - 117 IU/L   AST 18 0 - 40 IU/L   ALT 21 0 - 32 IU/L  Hgb A1c w/o eAG  Result Value Ref Range   Hgb A1c MFr Bld 6.1 (H) 4.8 - 5.6 %  Microalbumin, Urine Waived  Result Value Ref Range   Microalb, Ur Waived 30 (H) 0 - 19 mg/L   Creatinine, Urine Waived 200 10 - 300 mg/dL   Microalb/Creat Ratio <30 <30 mg/g  Hgb A1c w/o eAG  Result Value Ref Range   Hgb A1c MFr Bld 5.6 4.8 - 5.6 %  Please Note  Result Value Ref Range   Please note Comment       Assessment &  Plan:   Problem List Items Addressed This Visit      Cardiovascular and Mediastinum   Hypertension - Primary    Under good control on current regimen. Continue current regimen. Continue to monitor.  Call with any concerns. Refills given. Labs checked today.       Relevant Medications   hydrochlorothiazide (HYDRODIURIL) 25 MG tablet   enalapril (VASOTEC) 20 MG tablet   atenolol (TENORMIN) 25 MG tablet   Other Relevant Orders   Comprehensive metabolic panel   Microalbumin, Urine Waived   TSH   CBC with Differential/Platelet     Endocrine   IFG (impaired fasting glucose)    Checking labs today Await results. Call with any concerns. Continue diet and exercise.       Relevant Orders   Bayer DCA Hb A1c Waived   Comprehensive metabolic panel   Microalbumin, Urine Waived     Other   Morbid obesity (HCC)    Congratulated patient on 55lb weight loss! Continue diet and exercise. Call with any concerns.       Hyperlipidemia    Checking labs today Await results. Call with any concerns. Continue diet and exercise.       Relevant Medications   hydrochlorothiazide (HYDRODIURIL) 25 MG tablet   enalapril (VASOTEC) 20 MG tablet   atenolol (TENORMIN) 25 MG tablet   Other Relevant Orders   Comprehensive metabolic panel   Lipid Panel w/o Chol/HDL Ratio   Major depression in remission (HCC)    Remains in remission. Off medicine. Doing well.        Other Visit Diagnoses    Need for influenza vaccination       Relevant Orders   Flu Vaccine QUAD 36+ mos IM (Completed)       Follow up plan: Return Mole removal as able, 6 months physical.

## 2018-10-24 NOTE — Patient Instructions (Signed)
Influenza (Flu) Vaccine (Inactivated or Recombinant): What You Need to Know  1. Why get vaccinated?  Influenza vaccine can prevent influenza (flu).  Flu is a contagious disease that spreads around the [place] every year, usually between October and May. Anyone can get the flu, but it is more dangerous for some people. Infants and young children, people 57 years of age and older, pregnant women, and people with certain health conditions or a weakened immune system are at greatest risk of flu complications.  Pneumonia, bronchitis, sinus infections and ear infections are examples of flu-related complications. If you have a medical condition, such as heart disease, cancer or diabetes, flu can make it worse.  Flu can cause fever and chills, sore throat, muscle aches, fatigue, cough, headache, and runny or stuffy nose. Some people may have vomiting and diarrhea, though this is more common in children than adults.  Each year thousands of people in the [place] die from flu, and many more are hospitalized. Flu vaccine prevents millions of illnesses and flu-related visits to the doctor each year.  2. Influenza vaccine  CDC recommends everyone 6 months of age and older get vaccinated every flu season. Children 6 months through 8 years of age may need 2 doses during a single flu season. Everyone else needs only 1 dose each flu season.  It takes about 2 weeks for protection to develop after vaccination.  There are many flu viruses, and they are always changing. Each year a new flu vaccine is made to protect against three or four viruses that are likely to cause disease in the upcoming flu season. Even when the vaccine doesn't exactly match these viruses, it may still provide some protection.  Influenza vaccine does not cause flu.  Influenza vaccine may be given at the same time as other vaccines.  3. Talk with your health care provider  Tell your vaccine provider if the person getting the vaccine:  · Has had an  allergic reaction after a previous dose of influenza vaccine, or has any severe, life-threatening allergies.  · Has ever had Guillain-Barré Syndrome (also called GBS).  In some cases, your health care provider may decide to postpone influenza vaccination to a future visit.  People with minor illnesses, such as a cold, may be vaccinated. People who are moderately or severely ill should usually wait until they recover before getting influenza vaccine.  Your health care provider can give you more information.  4. Risks of a vaccine reaction  · Soreness, redness, and swelling where shot is given, fever, muscle aches, and headache can happen after influenza vaccine.  · There may be a very small increased risk of Guillain-Barré Syndrome (GBS) after inactivated influenza vaccine (the flu shot).  Young children who get the flu shot along with pneumococcal vaccine (PCV13), and/or DTaP vaccine at the same time might be slightly more likely to have a seizure caused by fever. Tell your health care provider if a child who is getting flu vaccine has ever had a seizure.  People sometimes faint after medical procedures, including vaccination. Tell your provider if you feel dizzy or have vision changes or ringing in the ears.  As with any medicine, there is a very remote chance of a vaccine causing a severe allergic reaction, other serious injury, or death.  5. What if there is a serious problem?  An allergic reaction could occur after the vaccinated person leaves the clinic. If you see signs of a severe allergic reaction (hives, swelling   of the face and throat, difficulty breathing, a fast heartbeat, dizziness, or weakness), call 9-1-1 and get the person to the nearest hospital.  For other signs that concern you, call your health care provider.  Adverse reactions should be reported to the Vaccine Adverse Event Reporting System (VAERS). Your health care provider will usually file this report, or you can do it yourself. Visit the  VAERS website at www.vaers.hhs.gov or call 1-800-822-7967.VAERS is only for reporting reactions, and VAERS staff do not give medical advice.  6. The National Vaccine Injury Compensation Program  The National Vaccine Injury Compensation Program (VICP) is a federal program that was created to compensate people who may have been injured by certain vaccines. Visit the VICP website at www.hrsa.gov/vaccinecompensation or call 1-800-338-2382 to learn about the program and about filing a claim. There is a time limit to file a claim for compensation.  7. How can I learn more?  · Ask your healthcare provider.  · Call your local or state health department.  · Contact the Centers for Disease Control and Prevention (CDC):  ? Call 1-800-232-4636 (1-800-CDC-INFO) or  ? Visit CDC's www.cdc.gov/flu  Vaccine Information Statement (Interim) Inactivated Influenza Vaccine (06/09/2018)  This information is not intended to replace advice given to you by your health care provider. Make sure you discuss any questions you have with your health care provider.  Document Released: 08/06/2006 Document Revised: 06/13/2018 Document Reviewed: 06/13/2018  Elsevier Interactive Patient Education © 2019 Elsevier Inc.

## 2018-10-24 NOTE — Assessment & Plan Note (Signed)
Checking labs today. Await results. Call with any concerns. Continue diet and exercise.  

## 2018-10-24 NOTE — Assessment & Plan Note (Signed)
Congratulated patient on 55lb weight loss! Continue diet and exercise. Call with any concerns.

## 2018-10-24 NOTE — Assessment & Plan Note (Signed)
Under good control on current regimen. Continue current regimen. Continue to monitor. Call with any concerns. Refills given. Labs checked today.  

## 2018-10-25 LAB — CBC WITH DIFFERENTIAL/PLATELET
Basophils Absolute: 0.1 10*3/uL (ref 0.0–0.2)
Basos: 1 %
EOS (ABSOLUTE): 0.1 10*3/uL (ref 0.0–0.4)
EOS: 2 %
Hematocrit: 39 % (ref 34.0–46.6)
Hemoglobin: 13.6 g/dL (ref 11.1–15.9)
Immature Grans (Abs): 0 10*3/uL (ref 0.0–0.1)
Immature Granulocytes: 1 %
Lymphocytes Absolute: 2.6 10*3/uL (ref 0.7–3.1)
Lymphs: 40 %
MCH: 30.4 pg (ref 26.6–33.0)
MCHC: 34.9 g/dL (ref 31.5–35.7)
MCV: 87 fL (ref 79–97)
Monocytes Absolute: 0.4 10*3/uL (ref 0.1–0.9)
Monocytes: 7 %
Neutrophils Absolute: 3.3 10*3/uL (ref 1.4–7.0)
Neutrophils: 49 %
Platelets: 229 10*3/uL (ref 150–450)
RBC: 4.47 x10E6/uL (ref 3.77–5.28)
RDW: 13.1 % (ref 12.3–15.4)
WBC: 6.5 10*3/uL (ref 3.4–10.8)

## 2018-10-25 LAB — LIPID PANEL W/O CHOL/HDL RATIO
Cholesterol, Total: 196 mg/dL (ref 100–199)
HDL: 52 mg/dL (ref >39)
LDL Calculated: 101 mg/dL — ABNORMAL HIGH (ref 0–99)
Triglycerides: 215 mg/dL — ABNORMAL HIGH (ref 0–149)
VLDL CHOLESTEROL CAL: 43 mg/dL — AB (ref 5–40)

## 2018-10-25 LAB — COMPREHENSIVE METABOLIC PANEL
A/G RATIO: 1.3 (ref 1.2–2.2)
ALBUMIN: 3.9 g/dL (ref 3.5–5.5)
ALT: 16 IU/L (ref 0–32)
AST: 14 IU/L (ref 0–40)
Alkaline Phosphatase: 86 IU/L (ref 39–117)
BILIRUBIN TOTAL: 0.4 mg/dL (ref 0.0–1.2)
BUN / CREAT RATIO: 13 (ref 9–23)
BUN: 10 mg/dL (ref 6–24)
CHLORIDE: 98 mmol/L (ref 96–106)
CO2: 23 mmol/L (ref 20–29)
Calcium: 9 mg/dL (ref 8.7–10.2)
Creatinine, Ser: 0.78 mg/dL (ref 0.57–1.00)
GFR calc non Af Amer: 85 mL/min/{1.73_m2} (ref >59)
GFR, EST AFRICAN AMERICAN: 98 mL/min/{1.73_m2} (ref >59)
Globulin, Total: 2.9 g/dL (ref 1.5–4.5)
Glucose: 88 mg/dL (ref 65–99)
POTASSIUM: 3.7 mmol/L (ref 3.5–5.2)
Sodium: 137 mmol/L (ref 134–144)
TOTAL PROTEIN: 6.8 g/dL (ref 6.0–8.5)

## 2018-10-25 LAB — TSH: TSH: 2.06 u[IU]/mL (ref 0.450–4.500)

## 2018-11-04 ENCOUNTER — Encounter: Payer: Self-pay | Admitting: Family Medicine

## 2018-11-04 DIAGNOSIS — N950 Postmenopausal bleeding: Secondary | ICD-10-CM

## 2018-11-14 ENCOUNTER — Ambulatory Visit: Payer: BC Managed Care – PPO | Admitting: Family Medicine

## 2018-11-14 ENCOUNTER — Encounter: Payer: Self-pay | Admitting: Family Medicine

## 2018-11-14 VITALS — BP 138/85 | HR 89 | Temp 98.3°F | Wt 295.4 lb

## 2018-11-14 DIAGNOSIS — N950 Postmenopausal bleeding: Secondary | ICD-10-CM | POA: Diagnosis not present

## 2018-11-14 NOTE — Progress Notes (Signed)
BP 138/85   Pulse 89   Temp 98.3 F (36.8 C) (Oral)   Wt 295 lb 6.4 oz (134 kg)   LMP 11/22/2016 (Approximate)   SpO2 98%   BMI 42.39 kg/m    Subjective:    Patient ID: Chloe Nelson, female    DOB: July 05, 1961, 58 y.o.   MRN: [id number]  HPI: Chloe Nelson is a 58 y.o. female  Chief Complaint  Patient presents with  . Labs Only   Had not had any period at all for 2 years. Had a 3 day period at the beginning of the month. No bad cramps or anything like that. She is concerned that the last time her weight got up over 300 she stopped her period and that that is all that's going on. Doesn't want to go to GYN unless she has to. She is otherwise feeling well with no other concerns or complaints at this time.   Relevant past medical, surgical, family and social history reviewed and updated as indicated. Interim medical history since our last visit reviewed. Allergies and medications reviewed and updated.  Review of Systems  Constitutional: Negative.   Respiratory: Negative.   Cardiovascular: Negative.   Genitourinary: Positive for menstrual problem and vaginal bleeding. Negative for decreased urine volume, difficulty urinating, dyspareunia, dysuria, enuresis, flank pain, frequency, genital sores, hematuria, pelvic pain, urgency, vaginal discharge and vaginal pain.  Psychiatric/Behavioral: Negative.     Per HPI unless specifically indicated above     Objective:    BP 138/85   Pulse 89   Temp 98.3 F (36.8 C) (Oral)   Wt 295 lb 6.4 oz (134 kg)   LMP 11/22/2016 (Approximate)   SpO2 98%   BMI 42.39 kg/m   Wt Readings from Last 3 Encounters:  11/14/18 295 lb 6.4 oz (134 kg)  10/24/18 289 lb 12.8 oz (131.5 kg)  12/28/17 286 lb 9 oz (130 kg)    Physical Exam Constitutional:      General: She is not in acute distress.    Appearance: She is well-developed.  HENT:     Head: Normocephalic and atraumatic.     Right Ear: Hearing normal.     Left Ear: Hearing normal.   Nose: Nose normal.  Eyes:     General: Lids are normal. No scleral icterus.       Right eye: No discharge.        Left eye: No discharge.     Conjunctiva/sclera: Conjunctivae normal.  Pulmonary:     Effort: Pulmonary effort is normal. No respiratory distress.  Musculoskeletal: Normal range of motion.  Skin:    Findings: No rash.  Neurological:     Mental Status: She is alert and oriented to person, place, and time.  Psychiatric:        Speech: Speech normal.        Behavior: Behavior normal.        Thought Content: Thought content normal.        Judgment: Judgment normal.     Results for orders placed or performed in visit on 10/24/18  Bayer DCA Hb A1c Waived  Result Value Ref Range   HB A1C (BAYER DCA - WAIVED) 5.5 <7.0 %  Comprehensive metabolic panel  Result Value Ref Range   Glucose 88 65 - 99 mg/dL   BUN 10 6 - 24 mg/dL   Creatinine, Ser 7.04 0.57 - 1.00 mg/dL   GFR calc non Af Amer 85 >59 mL/min/1.73   GFR  calc Af Amer 98 >59 mL/min/1.73   BUN/Creatinine Ratio 13 9 - 23   Sodium 137 134 - 144 mmol/L   Potassium 3.7 3.5 - 5.2 mmol/L   Chloride 98 96 - 106 mmol/L   CO2 23 20 - 29 mmol/L   Calcium 9.0 8.7 - 10.2 mg/dL   Total Protein 6.8 6.0 - 8.5 g/dL   Albumin 3.9 3.5 - 5.5 g/dL   Globulin, Total 2.9 1.5 - 4.5 g/dL   Albumin/Globulin Ratio 1.3 1.2 - 2.2   Bilirubin Total 0.4 0.0 - 1.2 mg/dL   Alkaline Phosphatase 86 39 - 117 IU/L   AST 14 0 - 40 IU/L   ALT 16 0 - 32 IU/L  Lipid Panel w/o Chol/HDL Ratio  Result Value Ref Range   Cholesterol, Total 196 100 - 199 mg/dL   Triglycerides 409215 (H) 0 - 149 mg/dL   HDL 52 >81>39 mg/dL   VLDL Cholesterol Cal 43 (H) 5 - 40 mg/dL   LDL Calculated 191101 (H) 0 - 99 mg/dL  Microalbumin, Urine Waived  Result Value Ref Range   Microalb, Ur Waived 10 0 - 19 mg/L   Creatinine, Urine Waived 100 10 - 300 mg/dL   Microalb/Creat Ratio <30 <30 mg/g  TSH  Result Value Ref Range   TSH 2.060 0.450 - 4.500 uIU/mL  CBC with  Differential/Platelet  Result Value Ref Range   WBC 6.5 3.4 - 10.8 x10E3/uL   RBC 4.47 3.77 - 5.28 x10E6/uL   Hemoglobin 13.6 11.1 - 15.9 g/dL   Hematocrit 47.839.0 29.534.0 - 46.6 %   MCV 87 79 - 97 fL   MCH 30.4 26.6 - 33.0 pg   MCHC 34.9 31.5 - 35.7 g/dL   RDW 62.113.1 30.812.3 - 65.715.4 %   Platelets 229 150 - 450 x10E3/uL   Neutrophils 49 Not Estab. %   Lymphs 40 Not Estab. %   Monocytes 7 Not Estab. %   Eos 2 Not Estab. %   Basos 1 Not Estab. %   Neutrophils Absolute 3.3 1.4 - 7.0 x10E3/uL   Lymphocytes Absolute 2.6 0.7 - 3.1 x10E3/uL   Monocytes Absolute 0.4 0.1 - 0.9 x10E3/uL   EOS (ABSOLUTE) 0.1 0.0 - 0.4 x10E3/uL   Basophils Absolute 0.1 0.0 - 0.2 x10E3/uL   Immature Granulocytes 1 Not Estab. %   Immature Grans (Abs) 0.0 0.0 - 0.1 x10E3/uL      Assessment & Plan:   Problem List Items Addressed This Visit    None    Visit Diagnoses    Postmenopausal bleeding    -  Primary   Will get blood work today and see if she's post-menopausal, if she is, will get her into GYN. Call with any concerns.    Relevant Orders   LH   FSH   Estradiol       Follow up plan: Return if symptoms worsen or fail to improve.

## 2018-11-15 LAB — FOLLICLE STIMULATING HORMONE: FSH: 16.7 m[IU]/mL

## 2018-11-15 LAB — ESTRADIOL: ESTRADIOL: 7.4 pg/mL

## 2018-11-15 LAB — LUTEINIZING HORMONE: LH: 8.6 m[IU]/mL

## 2018-12-05 ENCOUNTER — Ambulatory Visit: Payer: BC Managed Care – PPO | Admitting: Family Medicine

## 2018-12-05 ENCOUNTER — Encounter: Payer: Self-pay | Admitting: Family Medicine

## 2018-12-05 VITALS — BP 128/84 | HR 76 | Temp 99.1°F | Ht 70.0 in | Wt 292.4 lb

## 2018-12-05 DIAGNOSIS — J111 Influenza due to unidentified influenza virus with other respiratory manifestations: Secondary | ICD-10-CM

## 2018-12-05 LAB — VERITOR FLU A/B WAIVED
Influenza A: NEGATIVE
Influenza B: NEGATIVE

## 2018-12-05 MED ORDER — OSELTAMIVIR PHOSPHATE 75 MG PO CAPS: 75.0000 mg | ORAL_CAPSULE | Freq: Two times a day (BID) | ORAL | 0 refills | Status: DC

## 2018-12-05 NOTE — Progress Notes (Signed)
BP 128/84 (BP Location: Right Arm, Patient Position: Sitting, Cuff Size: Normal)   Pulse 76   Temp 99.1 F (37.3 C) (Oral)   Ht 5\' 10"  (1.778 m)   Wt 292 lb 6.4 oz (132.6 kg)   SpO2 96%   BMI 41.96 kg/m    Subjective:    Patient ID: Chloe Nelson, female    DOB: 01/05/1961, 58 y.o.   MRN: [id number]  HPI: Chloe Nelson is a 58 y.o. female  Chief Complaint  Patient presents with  . Fever    Patient states it was 100 degrees this AM.  . Exposure to influenza    Patient's a school teacher, it's going through school.   . Generalized Body Aches  . Diarrhea  . Chills   Diarrhea, upset stomach, and now fevers, chills, sweats, generalized body aches, rhinorrhea. Denies cough, CP, wheezing, SOB, vomiting. Taking ibuprofen with minimal relief. Lots of sick contacts, teaches at high school. Husband sick with similar sxs. No hx of pulmonary dz.   Relevant past medical, surgical, family and social history reviewed and updated as indicated. Interim medical history since our last visit reviewed. Allergies and medications reviewed and updated.  Review of Systems  Per HPI unless specifically indicated above     Objective:    BP 128/84 (BP Location: Right Arm, Patient Position: Sitting, Cuff Size: Normal)   Pulse 76   Temp 99.1 F (37.3 C) (Oral)   Ht 5\' 10"  (1.778 m)   Wt 292 lb 6.4 oz (132.6 kg)   SpO2 96%   BMI 41.96 kg/m   Wt Readings from Last 3 Encounters:  12/05/18 292 lb 6.4 oz (132.6 kg)  11/14/18 295 lb 6.4 oz (134 kg)  10/24/18 289 lb 12.8 oz (131.5 kg)    Physical Exam Vitals signs and nursing note reviewed.  Constitutional:      Appearance: Normal appearance.  HENT:     Head: Atraumatic.     Right Ear: Tympanic membrane and external ear normal.     Left Ear: Tympanic membrane and external ear normal.     Nose:     Comments: Nasal mucosa mildly erythematous    Mouth/Throat:     Mouth: Mucous membranes are moist.     Pharynx: Posterior oropharyngeal  erythema present.  Eyes:     Extraocular Movements: Extraocular movements intact.     Conjunctiva/sclera: Conjunctivae normal.  Neck:     Musculoskeletal: Normal range of motion and neck supple.  Cardiovascular:     Rate and Rhythm: Normal rate and regular rhythm.     Heart sounds: Normal heart sounds.  Pulmonary:     Effort: Pulmonary effort is normal.     Breath sounds: Normal breath sounds. No wheezing.  Abdominal:     General: Bowel sounds are normal.     Palpations: Abdomen is soft.     Tenderness: There is no abdominal tenderness.  Musculoskeletal: Normal range of motion.  Lymphadenopathy:     Cervical: No cervical adenopathy.  Skin:    General: Skin is warm and dry.  Neurological:     Mental Status: She is alert and oriented to person, place, and time.  Psychiatric:        Mood and Affect: Mood normal.        Thought Content: Thought content normal.     Results for orders placed or performed in visit on 11/14/18  [facility]  Result Value Ref Range   LH 8.6 mIU/mL  [facility]  Result Value Ref Range   FSH 16.7 mIU/mL  Estradiol  Result Value Ref Range   Estradiol 7.4 pg/mL      Assessment & Plan:   Problem List Items Addressed This Visit    None    Visit Diagnoses    Influenza    -  Primary   Rapid flu neg, but sxs consistent with influenza. Start tamiflu, supportive care. Return precautions given   Relevant Medications   oseltamivir (TAMIFLU) 75 MG capsule   Other Relevant Orders   Veritor Flu A/B Waived       Follow up plan: Return if symptoms worsen or fail to improve.

## 2018-12-20 ENCOUNTER — Ambulatory Visit: Payer: Self-pay

## 2018-12-20 NOTE — Telephone Encounter (Signed)
Noted. Thanks.

## 2018-12-20 NOTE — Telephone Encounter (Signed)
Spoke with pt and she stated that she is going to try the lemon drops and ibuprofen and will call back to schedule an appt if she felt that she needed one.

## 2018-12-20 NOTE — Telephone Encounter (Signed)
Pt c/o bean sized swollen lymph node on the right side at the back of the jaw. No matching node on the other side of jaw. Pt stated that the swollen lymph node is less than an inch in size. Pt noted the node yesterday. Pt had the flu last week. Pt stated that the lymph node hurts when she eats something sweet or sour and it brings "tears to my eyes." Pt has been taking Ibuprofen with effect for the intermittent pain.  Care advice given and pt verbalized understanding. Pt refused to make an office visit and stated she will call if the node does not improve. 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  Routing to office.   Reason for Disposition . [1] Very tender to the touch AND [2] no fever  Answer Assessment - Initial Assessment Questions 1. LOCATION: "Where is the swollen node located?" "Is the matching node on the other side of the body also swollen?"      Right side down at the back of jaw-no 2. SIZE: "How big is the node?" (Inches or centimeters) (or compare to common objects such as pea, bean, marble, golf ball)      Bean less than an inch in size 3. ONSET: "When did the swelling start?"      yesterday 4. NECK NODES: "Is there a sore throat, runny nose or other symptoms of a cold?"      Just got over flu- no 5. GROIN OR ARMPIT NODES: "Is there a sore, scratch, cut or painful red area on that arm or leg?"      n/a 6. FEVER: "Do you have a fever?" If so, ask: "What is it, how was it measured, and when did it start?"      no 7. CAUSE: "What do you think is causing the swollen lymph nodes?"     Left over from the flu or side effect form the flu 8. OTHER SYMPTOMS: "Do you have any other symptoms?"     Hurts when eating something sweet or sour brings tears to her eyes- has been taking Ibuprofen 9. PREGNANCY: "Is there any chance you are pregnant?" "When was your last menstrual period?"     n/a  Protocols used: LYMPH NODES SWOLLEN-A-AH

## 2019-02-14 ENCOUNTER — Telehealth: Payer: Self-pay | Admitting: Physician Assistant

## 2019-02-14 DIAGNOSIS — R51 Headache: Principal | ICD-10-CM

## 2019-02-14 DIAGNOSIS — R519 Headache, unspecified: Secondary | ICD-10-CM

## 2019-02-14 NOTE — Progress Notes (Signed)
Chloe Nelson,  Unfortunately we can not treat salivary gland problems over an evisit.  Please make an appointment with the individual who provided you with care previously so they can help formulate the next best step in your care.   Deliah Boston, MS, PA-C 11:24 AM, 02/14/2019  ===View-only below this line===   ----- Message -----    From: Chloe Nelson    Sent: 02/14/2019 11:12 AM EDT      To: E-Visit Mailing List Subject: E-Visit Submission: Sinus Problems  E-Visit Submission: Sinus Problems --------------------------------  Question: Which of the following have you been experiencing? Answer:   Neck pain  Question: Have these symptoms significantly worsened over the last two to three days? Answer:   Yes  Question: Have you had any of the following? Answer:   None of the above  Question: How long have you been having these symptoms? Answer:   2 days  Question: Do you have a fever? Answer:   No, I do not have a fever  Question: Do you smoke? Answer:   No  Question: Have you ever smoked? Answer:   I smoked in the past, but quit  Question: Do you have any chronic illnesses, such as diabetes, heart disease, kidney disease, or lung disease, or any illness that would weaken your body's ability to fight infection? Answer:   No  Question: When you blow your nose, what color is the mucus? Answer:   Mostly clear  Question: Have you experienced similar problems in the past? Answer:   Yes  Question: What treatments have worked in the past?  Answer:   I have a swollen salivary gland with a chipmunk cheek on one side only.  At [facility] walk in clinic they prescribed Clindamycin.  That seems to work along with lemon drops.  I am looking for a refill to the antibiotics since it has been over 6 weeks.  Question: What treatment(s) in the past have been unsuccessful? Answer:     Question: Is this illness similar to previous illnesses you have had?  How is it the same?  How is it  different? Answer:   Exactly the same, but not as advanced yet.  Question: Have you recently been hospitalized? Answer:   No  Question: What medications are you currently taking for these symptoms? Answer:   Nothing  Question: Please list your medication allergies that you may have ? (If 'none' , please list as 'none') Answer:   pencillin  Question: Please list any additional comments  Answer:

## 2019-06-09 ENCOUNTER — Encounter: Payer: Self-pay | Admitting: Family Medicine

## 2019-06-13 ENCOUNTER — Other Ambulatory Visit: Payer: Self-pay | Admitting: Family Medicine

## 2019-07-27 ENCOUNTER — Encounter: Payer: Self-pay | Admitting: Family Medicine

## 2019-07-28 ENCOUNTER — Encounter: Payer: Self-pay | Admitting: Family Medicine

## 2019-07-28 ENCOUNTER — Other Ambulatory Visit: Payer: Self-pay | Admitting: Family Medicine

## 2019-07-28 ENCOUNTER — Ambulatory Visit: Payer: BC Managed Care – PPO | Admitting: Family Medicine

## 2019-07-28 DIAGNOSIS — Z20828 Contact with and (suspected) exposure to other viral communicable diseases: Secondary | ICD-10-CM

## 2019-07-28 DIAGNOSIS — Z20822 Contact with and (suspected) exposure to covid-19: Secondary | ICD-10-CM

## 2019-07-28 NOTE — Progress Notes (Signed)
covid testing

## 2019-08-02 ENCOUNTER — Telehealth: Payer: Self-pay

## 2019-08-02 NOTE — Telephone Encounter (Signed)
Copied from [place] [id number]. Topic: General - Other >> Aug 02, 2019  7:53 AM Rayann Heman wrote: Reason for CRM: pt called and stated that she tested positive at [facility]. Pt states that she is feeling okay. Pt does have a cough and body aches. Pt also is very tired. Pt would like to know what she should do.  Called and clarified with patient. She tested positive for COVID.

## 2019-08-02 NOTE — Telephone Encounter (Signed)
Please schedule virtual visit

## 2019-08-02 NOTE — Telephone Encounter (Signed)
Called pt no answer, will try back after school hours.

## 2019-08-03 ENCOUNTER — Ambulatory Visit (INDEPENDENT_AMBULATORY_CARE_PROVIDER_SITE_OTHER): Payer: BC Managed Care – PPO | Admitting: Family Medicine

## 2019-08-03 ENCOUNTER — Encounter: Payer: Self-pay | Admitting: Family Medicine

## 2019-08-03 ENCOUNTER — Other Ambulatory Visit: Payer: Self-pay

## 2019-08-03 VITALS — BP 135/76 | HR 75 | Temp 97.2°F

## 2019-08-03 DIAGNOSIS — U071 COVID-19: Secondary | ICD-10-CM | POA: Diagnosis not present

## 2019-08-03 MED ORDER — BENZONATATE 200 MG PO CAPS: 200.0000 mg | ORAL_CAPSULE | Freq: Three times a day (TID) | ORAL | 0 refills | Status: AC | PRN

## 2019-08-03 NOTE — Progress Notes (Signed)
   BP 135/76   Pulse 75   Temp (!) 97.2 F (36.2 C)    Subjective:    Patient ID: Chloe Nelson, female    DOB: 03-19-1961, 58 y.o.   MRN: [id number]  HPI: Chloe Nelson is a 58 y.o. female  Chief Complaint  Patient presents with  . COVID    Tested on Monday, states she has been taking the cough medicine from behind the counter at the pharmacy that has codeine, states it is not really helping     . This visit was completed via telephone due to the restrictions of the COVID-19 pandemic. All issues as above were discussed and addressed. Physical exam was done as above through visual confirmation on telephone. If it was felt that the patient should be evaluated in the office, they were directed there. The patient verbally consented to this visit. . Location of the patient: home . Location of the provider: work . Those involved with this call:  . Provider: Merrie Roof, PA-C . CMA: Yvonna Alanis, [place] . Front Desk/Registration: Don Perking  . Time spent on call: 15 minutes on the phone discussing health concerns. 5 minutes total spent in review of patient's record and preparation of their chart. I verified patient identity using two factors (patient name and date of birth). Patient consents verbally to being seen via telemedicine visit today.   Tested positive for COVID-19 Wednesday and having severe fatigue, dry cough, body aches. Thinks her cough started coughing about 2 weeks ago and then she was exposed last week to COVID-19. Body aches started shortly after that. Taking flonase and codeine cough syrup with mild benefit. Tried a puff of albuterol inhaler she had laying around which also helped. Denies fevers, chills, CP, SOB.   Relevant past medical, surgical, family and social history reviewed and updated as indicated. Interim medical history since our last visit reviewed. Allergies and medications reviewed and updated.  Review of Systems  Per HPI unless specifically  indicated above     Objective:    BP 135/76   Pulse 75   Temp (!) 97.2 F (36.2 C)   Wt Readings from Last 3 Encounters:  12/05/18 292 lb 6.4 oz (132.6 kg)  11/14/18 295 lb 6.4 oz (134 kg)  10/24/18 289 lb 12.8 oz (131.5 kg)    Physical Exam  Unable to perform PE due to patient lack of access to video technology for today's visit.   Results for orders placed or performed in visit on 12/05/18  Veritor Flu A/B Waived  Result Value Ref Range   Influenza A Negative Negative   Influenza B Negative Negative      Assessment & Plan:   Problem List Items Addressed This Visit    None    Visit Diagnoses    COVID-19 virus infection    -  Primary   Continue home quarantine, tessalon perles sent, supportive care reviewed. F/u if worsening or not improving       Follow up plan: Return if symptoms worsen or fail to improve.

## 2019-08-03 NOTE — Telephone Encounter (Signed)
Done

## 2019-08-08 ENCOUNTER — Ambulatory Visit: Payer: Self-pay | Admitting: *Deleted

## 2019-08-08 NOTE — Telephone Encounter (Signed)
Patient and spouse tested positive for covid19 last week. Patient inquiring if antibiotic woud be helpful with symptoms. Reviewed symptoms including headache/cough/body aches/occasional fever/occasional diarrhea.  Advised taking tylenol or ibuprofen for aches and fever. As suggested earlier take tessolon perles and chloricidin hcl for   Reason for Disposition . Health Information question, no triage required and triager able to answer question  Answer Assessment - Initial Assessment Questions 1. REASON FOR CALL or QUESTION: "What is your reason for calling today?" or "How can I best help you?" or "What question do you have that I can help answer?"     Will antibiotic help covid symptoms?  Protocols used: INFORMATION ONLY CALL - NO TRIAGE-A-AH

## 2022-03-26 ENCOUNTER — Ambulatory Visit: Payer: Self-pay | Admitting: Dermatology

## 2022-12-31 ENCOUNTER — Other Ambulatory Visit: Payer: Self-pay

## 2023-08-26 ENCOUNTER — Ambulatory Visit
Admission: RE | Admit: 2023-08-26 | Discharge: 2023-08-26 | Disposition: A | Discharge status: Home / Self Care | Payer: BC Managed Care – PPO | Source: Ambulatory Visit | Attending: Student | Admitting: Student

## 2023-08-26 ENCOUNTER — Other Ambulatory Visit: Payer: Self-pay | Admitting: Student

## 2023-08-26 DIAGNOSIS — M25561 Pain in right knee: Secondary | ICD-10-CM | POA: Insufficient documentation

## 2023-08-26 DIAGNOSIS — M79661 Pain in right lower leg: Secondary | ICD-10-CM | POA: Diagnosis present

## 2023-08-26 DIAGNOSIS — I83811 Varicose veins of right lower extremities with pain: Secondary | ICD-10-CM | POA: Insufficient documentation
